# Patient Record
Sex: Female | Born: 1975 | Race: Black or African American | Hispanic: No | Marital: Married | Smoking: Never smoker
Health system: Southern US, Community
[De-identification: ages and names within clinical notes are randomized; demographics above are authoritative.]

## PROBLEM LIST (undated history)

## (undated) DIAGNOSIS — Z8619 Personal history of other infectious and parasitic diseases: Secondary | ICD-10-CM

## (undated) DIAGNOSIS — Z87898 Personal history of other specified conditions: Secondary | ICD-10-CM

## (undated) DIAGNOSIS — Z8709 Personal history of other diseases of the respiratory system: Secondary | ICD-10-CM

## (undated) DIAGNOSIS — K219 Gastro-esophageal reflux disease without esophagitis: Secondary | ICD-10-CM

## (undated) DIAGNOSIS — H409 Unspecified glaucoma: Secondary | ICD-10-CM

## (undated) DIAGNOSIS — H5010 Unspecified exotropia: Secondary | ICD-10-CM

## (undated) DIAGNOSIS — M1990 Unspecified osteoarthritis, unspecified site: Secondary | ICD-10-CM

## (undated) DIAGNOSIS — G43909 Migraine, unspecified, not intractable, without status migrainosus: Secondary | ICD-10-CM

## (undated) HISTORY — PX: DILATION AND CURETTAGE OF UTERUS: SHX78

## (undated) HISTORY — PX: REFRACTIVE SURGERY: SHX103

---

## 2000-11-17 HISTORY — PX: STRABISMUS SURGERY: SHX218

## 2004-10-30 ENCOUNTER — Encounter: Admission: RE | Admit: 2004-10-30 | Discharge: 2004-10-30 | Payer: Self-pay | Admitting: Occupational Medicine

## 2004-11-18 ENCOUNTER — Encounter: Admission: RE | Admit: 2004-11-18 | Discharge: 2004-12-12 | Payer: Self-pay | Admitting: Occupational Medicine

## 2005-02-01 ENCOUNTER — Emergency Department (HOSPITAL_COMMUNITY): Admission: EM | Admit: 2005-02-01 | Discharge: 2005-02-01 | Payer: Self-pay | Admitting: Family Medicine

## 2005-04-18 IMAGING — CR DG LUMBAR SPINE COMPLETE 4+V
5 series · 5 of 5 positions shown · non-contrast
Comparison: None.

CLINICAL DATA: Back pain. 
 LUMBAR SPINE ? FOUR VIEW:

[view not recorded (1 of 5)]
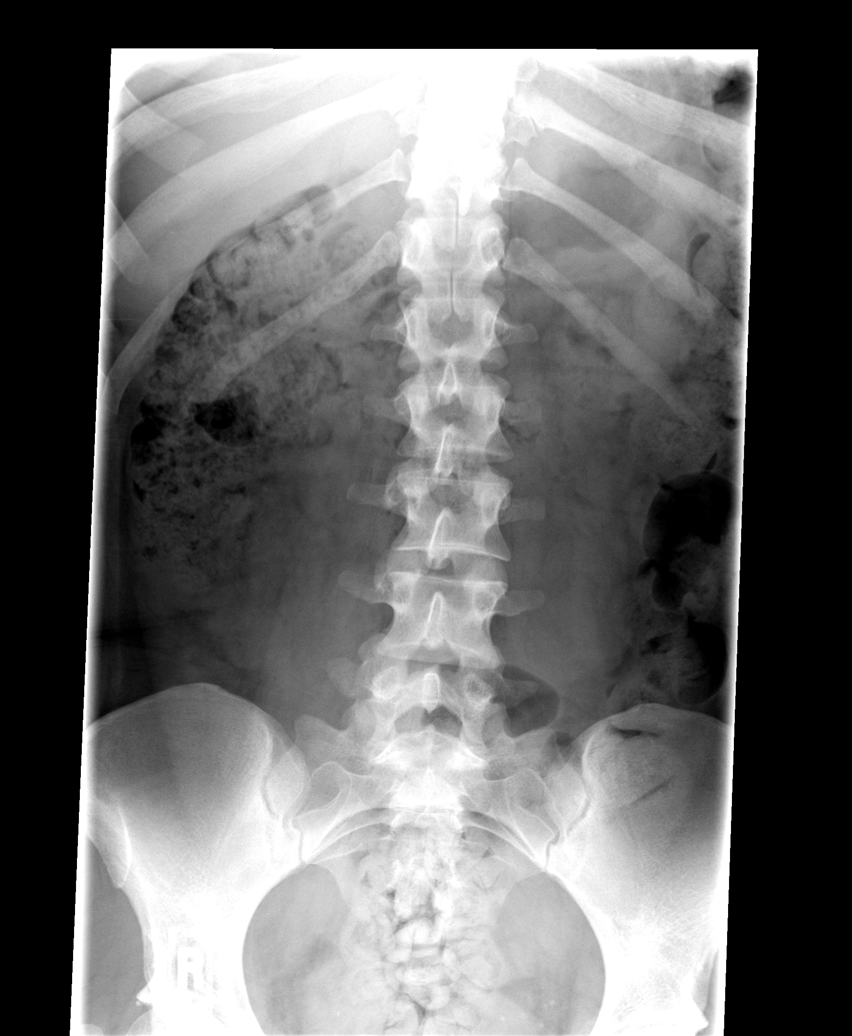

[view not recorded (2 of 5)]
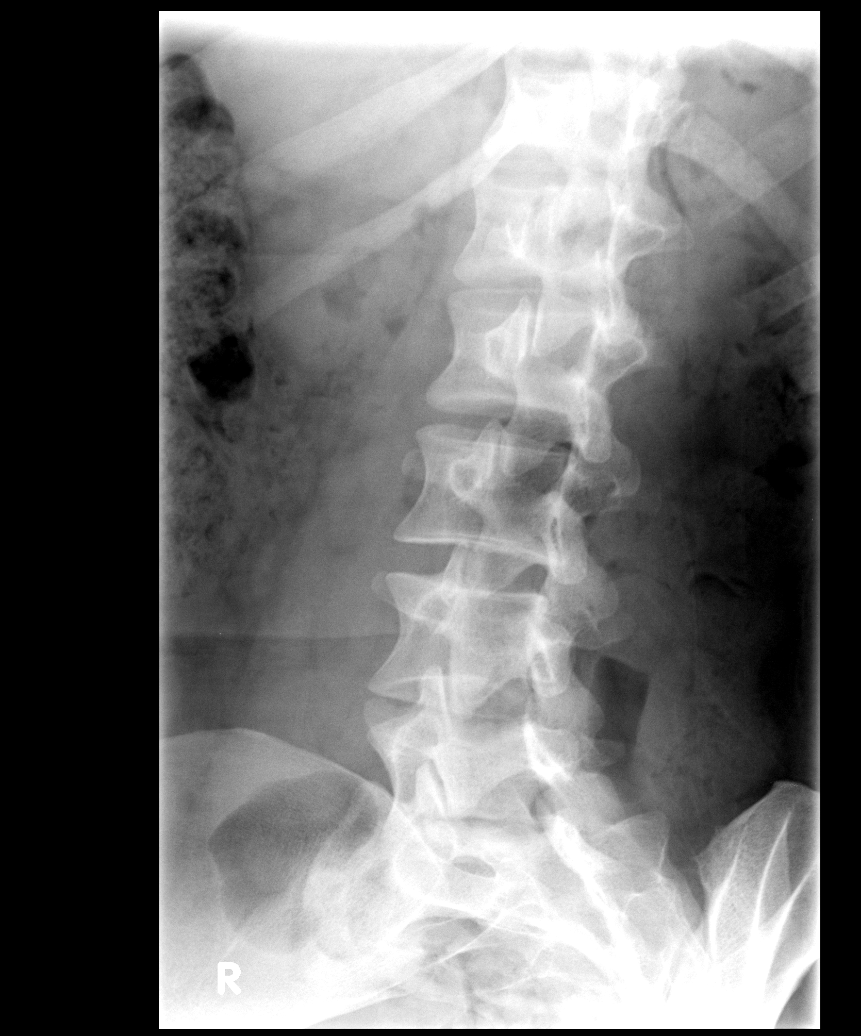

[view not recorded (3 of 5)]
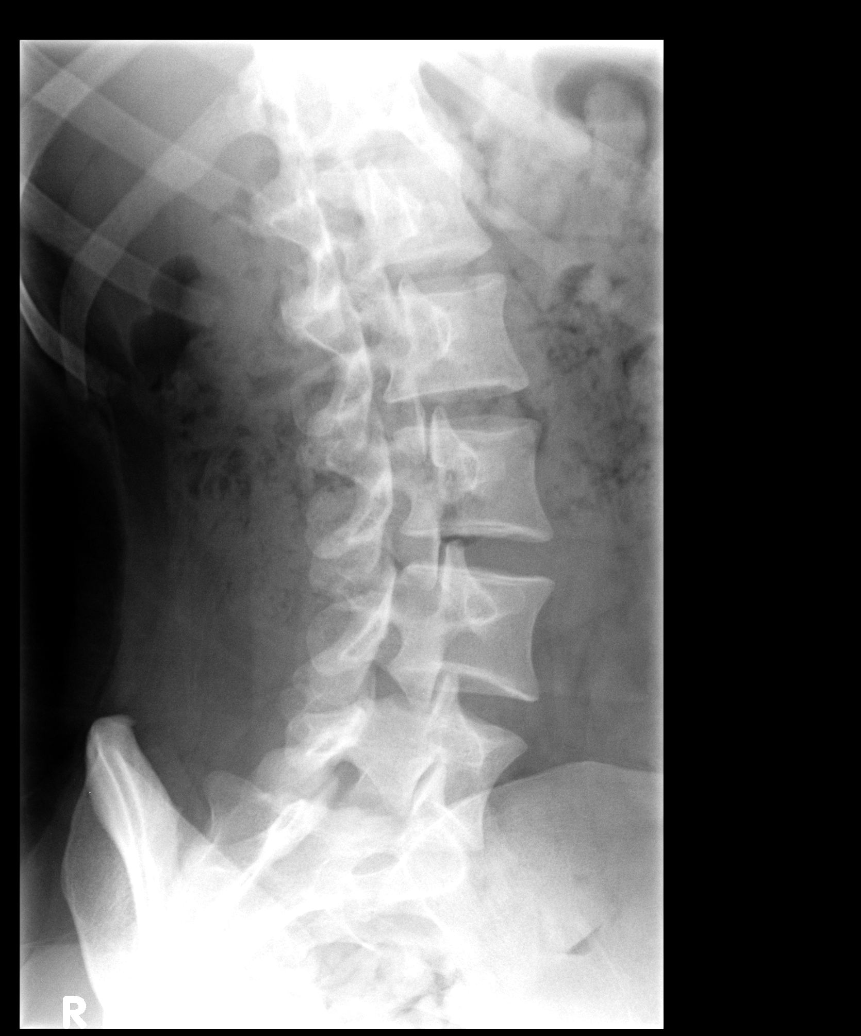

[view not recorded (4 of 5)]
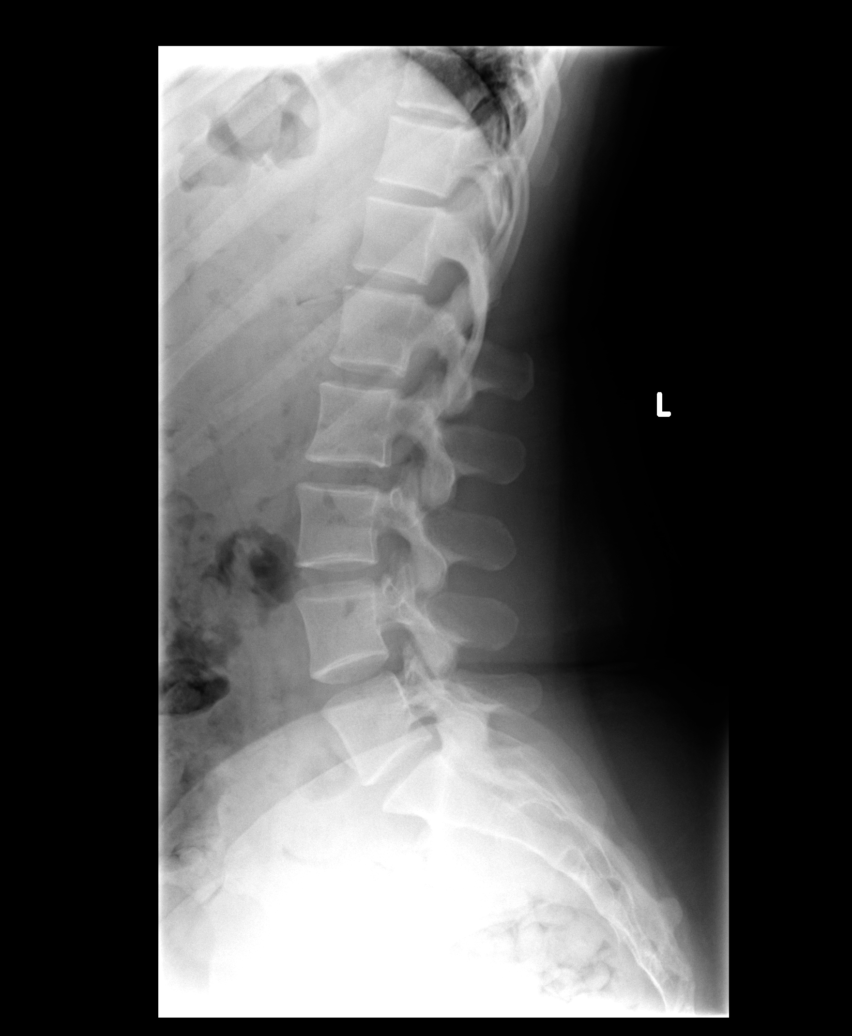

[view not recorded (5 of 5)]
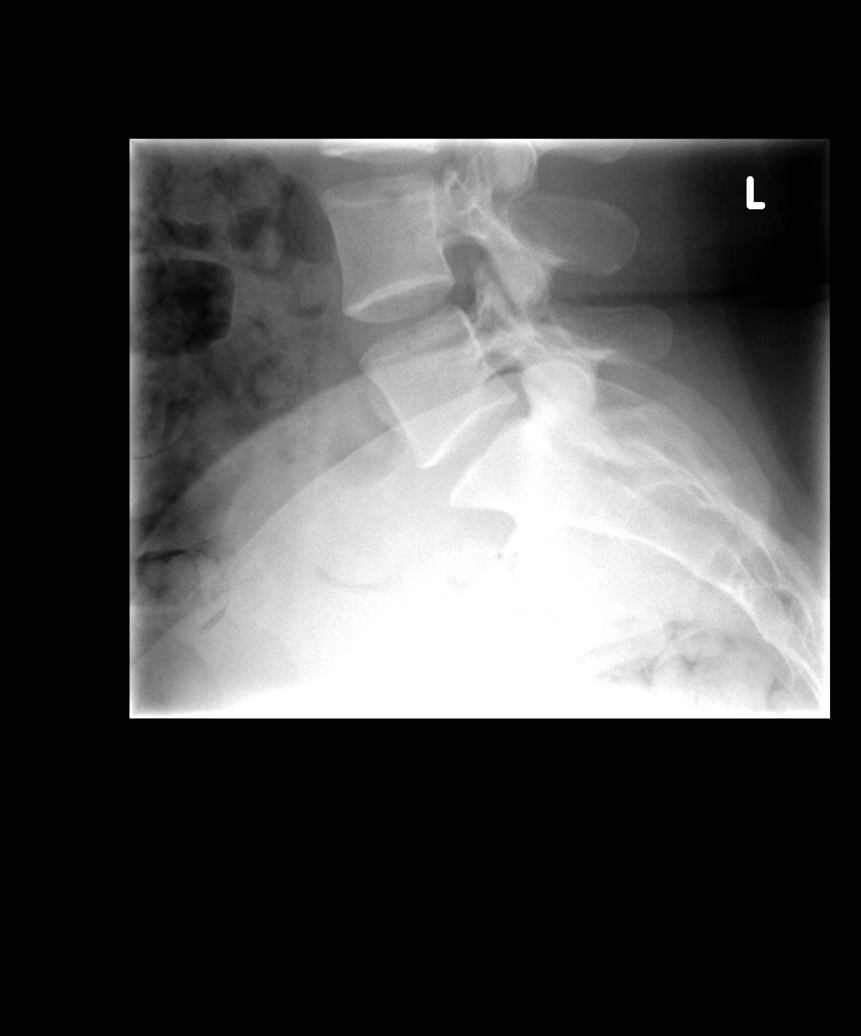

[5 of 5 positions shown; findings below may reference images not displayed]

FINDINGS: Four view lumbar spine series shows normal alignment in this patient with five non-rib bearing lumbar-type vertebral bodies.  The intervertebral disc spaces are preserved throughout.  Right pedicle at the level of L-3 appears enlarged on the frontal view but unremarkable on the oblique images and the frontal projection may be related to projection.  Frontal image demonstrates normal SI joints bilaterally.
IMPRESSION: No evidence for acute fracture or subluxation.

## 2005-09-11 ENCOUNTER — Ambulatory Visit: Payer: Self-pay | Admitting: Family Medicine

## 2005-10-31 ENCOUNTER — Ambulatory Visit: Payer: Self-pay | Admitting: Family Medicine

## 2005-11-25 ENCOUNTER — Ambulatory Visit: Payer: Self-pay | Admitting: Family Medicine

## 2005-12-25 ENCOUNTER — Ambulatory Visit: Payer: Self-pay | Admitting: Family Medicine

## 2006-01-08 ENCOUNTER — Ambulatory Visit: Payer: Self-pay | Admitting: Family Medicine

## 2006-01-13 ENCOUNTER — Ambulatory Visit: Payer: Self-pay | Admitting: Family Medicine

## 2006-01-28 ENCOUNTER — Ambulatory Visit: Payer: Self-pay | Admitting: Family Medicine

## 2006-03-05 ENCOUNTER — Other Ambulatory Visit: Admission: RE | Admit: 2006-03-05 | Discharge: 2006-03-05 | Payer: Self-pay | Admitting: Family Medicine

## 2006-03-05 ENCOUNTER — Ambulatory Visit: Payer: Self-pay | Admitting: Family Medicine

## 2006-03-05 ENCOUNTER — Encounter: Payer: Self-pay | Admitting: Family Medicine

## 2006-03-05 LAB — CONVERTED CEMR LAB: Pap Smear: NORMAL

## 2006-03-10 ENCOUNTER — Ambulatory Visit: Payer: Self-pay | Admitting: Family Medicine

## 2006-03-19 ENCOUNTER — Ambulatory Visit: Payer: Self-pay | Admitting: Family Medicine

## 2006-04-23 ENCOUNTER — Ambulatory Visit: Payer: Self-pay | Admitting: Family Medicine

## 2006-06-02 ENCOUNTER — Ambulatory Visit: Payer: Self-pay | Admitting: Family Medicine

## 2006-06-15 ENCOUNTER — Ambulatory Visit: Payer: Self-pay | Admitting: Family Medicine

## 2006-06-22 ENCOUNTER — Ambulatory Visit: Payer: Self-pay | Admitting: Family Medicine

## 2006-07-23 ENCOUNTER — Ambulatory Visit: Payer: Self-pay | Admitting: Family Medicine

## 2006-08-04 ENCOUNTER — Ambulatory Visit: Payer: Self-pay | Admitting: Family Medicine

## 2006-08-25 DIAGNOSIS — K589 Irritable bowel syndrome without diarrhea: Secondary | ICD-10-CM

## 2006-08-25 DIAGNOSIS — G43909 Migraine, unspecified, not intractable, without status migrainosus: Secondary | ICD-10-CM | POA: Insufficient documentation

## 2006-08-25 DIAGNOSIS — H409 Unspecified glaucoma: Secondary | ICD-10-CM | POA: Insufficient documentation

## 2006-08-25 DIAGNOSIS — K5900 Constipation, unspecified: Secondary | ICD-10-CM | POA: Insufficient documentation

## 2006-08-25 DIAGNOSIS — E039 Hypothyroidism, unspecified: Secondary | ICD-10-CM | POA: Insufficient documentation

## 2006-08-28 ENCOUNTER — Ambulatory Visit: Payer: Self-pay | Admitting: Family Medicine

## 2006-08-28 LAB — CONVERTED CEMR LAB
Basophils Absolute: 0 10*3/uL (ref 0.0–0.1)
Basophils percent auto: 0 % (ref 0–1)
Eosinophils Absolute: 0 cells/mcL (ref 0.0–0.7)
Eosinophils Relative: 0 % (ref 0–5)
HCT: 33.1 % — ABNORMAL LOW (ref 36.0–46.0)
Hemoglobin: 10.2 g/dL — ABNORMAL LOW (ref 12.0–15.0)
Leukocyte count, blood: 4.1 10*9/L (ref 4.0–10.5)
Lymphocytes Relative: 28 % (ref 12–46)
Lymphs Abs: 1.2 10*3/uL (ref 0.7–3.3)
MCHC: 30.8 g/dL (ref 30.0–36.0)
MCV: 87.8 fL (ref 78.0–100.0)
Monocytes Absolute: 0.5 10*3/uL (ref 0.2–0.7)
Monocytes Relative: 12 % — ABNORMAL HIGH (ref 3–11)
Neutro Abs: 2.4 10*3/uL (ref 1.7–7.7)
Neutrophils Relative %: 59 % (ref 43–77)
Platelets: 276 10*3/uL (ref 150–400)
RBC: 3.77 M/uL — ABNORMAL LOW (ref 3.87–5.11)
RDW: 15.4 % — ABNORMAL HIGH (ref 11.5–14.0)

## 2006-09-01 ENCOUNTER — Ambulatory Visit: Payer: Self-pay | Admitting: Family Medicine

## 2006-10-01 ENCOUNTER — Encounter: Payer: Self-pay | Admitting: Family Medicine

## 2006-10-01 DIAGNOSIS — D649 Anemia, unspecified: Secondary | ICD-10-CM

## 2006-10-07 ENCOUNTER — Ambulatory Visit: Payer: Self-pay | Admitting: Family Medicine

## 2006-10-09 DIAGNOSIS — N760 Acute vaginitis: Secondary | ICD-10-CM | POA: Insufficient documentation

## 2006-10-26 ENCOUNTER — Ambulatory Visit: Payer: Self-pay | Admitting: Family Medicine

## 2006-11-12 ENCOUNTER — Ambulatory Visit: Payer: Self-pay | Admitting: Family Medicine

## 2006-11-12 DIAGNOSIS — M545 Low back pain: Secondary | ICD-10-CM

## 2006-11-12 LAB — CONVERTED CEMR LAB
Bilirubin Urine: NEGATIVE
Blood in Urine, dipstick: NEGATIVE
Glucose, Urine, Semiquant: NEGATIVE
Ketones, urine, test strip: NEGATIVE
Nitrite: NEGATIVE
Protein, U semiquant: NEGATIVE
Specific Gravity, Urine: >=1.03
Urobilinogen, UA: 0.2
WBC Urine, dipstick: NEGATIVE
pH: 6

## 2006-12-02 ENCOUNTER — Ambulatory Visit: Payer: Self-pay | Admitting: Family Medicine

## 2006-12-02 DIAGNOSIS — K5909 Other constipation: Secondary | ICD-10-CM | POA: Insufficient documentation

## 2006-12-03 ENCOUNTER — Telehealth: Payer: Self-pay | Admitting: Family Medicine

## 2006-12-22 ENCOUNTER — Telehealth: Payer: Self-pay | Admitting: Family Medicine

## 2007-01-11 ENCOUNTER — Telehealth (INDEPENDENT_AMBULATORY_CARE_PROVIDER_SITE_OTHER): Payer: Self-pay | Admitting: *Deleted

## 2007-01-25 ENCOUNTER — Encounter: Payer: Self-pay | Admitting: Family Medicine

## 2007-01-25 LAB — CONVERTED CEMR LAB
ALT: 11 units/L
AST: 16 units/L
Albumin: 3.9 g/dL
Alkaline Phosphatase: 78 units/L
BUN: 120 mg/dL
CO2: 19 meq/L
Calcium: 8.7 mg/dL
Chloride: 112 meq/L
Creatinine, Ser: 0.83 mg/dL
Glucose, Bld: 89 mg/dL
Microalb, Ur: 7.39 mg/dL
Potassium: 3.8 meq/L
Sodium: 141 meq/L
Total Bilirubin: 0.2 mg/dL
Total Protein: 7.1 g/dL

## 2007-01-26 ENCOUNTER — Ambulatory Visit: Payer: Self-pay | Admitting: Family Medicine

## 2007-01-29 ENCOUNTER — Encounter: Payer: Self-pay | Admitting: Family Medicine

## 2007-02-23 ENCOUNTER — Telehealth: Payer: Self-pay | Admitting: Family Medicine

## 2007-02-24 ENCOUNTER — Encounter: Payer: Self-pay | Admitting: Family Medicine

## 2007-02-24 ENCOUNTER — Telehealth: Payer: Self-pay | Admitting: Family Medicine

## 2007-03-10 ENCOUNTER — Encounter: Payer: Self-pay | Admitting: Family Medicine

## 2007-03-11 ENCOUNTER — Ambulatory Visit: Payer: Self-pay | Admitting: Family Medicine

## 2007-03-12 ENCOUNTER — Telehealth: Payer: Self-pay | Admitting: Family Medicine

## 2007-03-19 ENCOUNTER — Telehealth: Payer: Self-pay | Admitting: Family Medicine

## 2007-03-30 ENCOUNTER — Telehealth: Payer: Self-pay | Admitting: Family Medicine

## 2007-04-01 ENCOUNTER — Telehealth: Payer: Self-pay | Admitting: Family Medicine

## 2007-04-05 ENCOUNTER — Encounter: Payer: Self-pay | Admitting: Family Medicine

## 2007-04-09 ENCOUNTER — Telehealth: Payer: Self-pay | Admitting: Family Medicine

## 2007-04-15 ENCOUNTER — Ambulatory Visit: Payer: Self-pay | Admitting: Family Medicine

## 2007-04-15 DIAGNOSIS — N644 Mastodynia: Secondary | ICD-10-CM

## 2007-04-15 DIAGNOSIS — R809 Proteinuria, unspecified: Secondary | ICD-10-CM | POA: Insufficient documentation

## 2007-04-15 LAB — CONVERTED CEMR LAB
Creatinine, urine POC: 100 mg/dL
Microalbumin U total vol: 10 mg/L

## 2007-04-20 ENCOUNTER — Encounter: Payer: Self-pay | Admitting: Family Medicine

## 2007-04-26 ENCOUNTER — Encounter: Payer: Self-pay | Admitting: Family Medicine

## 2007-04-29 ENCOUNTER — Encounter: Payer: Self-pay | Admitting: Family Medicine

## 2007-05-03 ENCOUNTER — Telehealth (INDEPENDENT_AMBULATORY_CARE_PROVIDER_SITE_OTHER): Payer: Self-pay | Admitting: *Deleted

## 2007-05-12 ENCOUNTER — Encounter: Payer: Self-pay | Admitting: Family Medicine

## 2007-05-27 ENCOUNTER — Other Ambulatory Visit: Admission: RE | Admit: 2007-05-27 | Discharge: 2007-05-27 | Payer: Self-pay | Admitting: Family Medicine

## 2007-05-27 ENCOUNTER — Ambulatory Visit: Payer: Self-pay | Admitting: Family Medicine

## 2007-05-27 ENCOUNTER — Encounter: Payer: Self-pay | Admitting: Family Medicine

## 2007-05-27 DIAGNOSIS — E785 Hyperlipidemia, unspecified: Secondary | ICD-10-CM | POA: Insufficient documentation

## 2007-05-29 ENCOUNTER — Encounter: Payer: Self-pay | Admitting: Family Medicine

## 2007-05-29 LAB — CONVERTED CEMR LAB
Clue Cells Wet Prep HPF POC: NONE SEEN
Trich, Wet Prep: NONE SEEN
Yeast Wet Prep HPF POC: NONE SEEN

## 2007-06-01 ENCOUNTER — Telehealth (INDEPENDENT_AMBULATORY_CARE_PROVIDER_SITE_OTHER): Payer: Self-pay | Admitting: *Deleted

## 2007-06-09 ENCOUNTER — Encounter: Payer: Self-pay | Admitting: Family Medicine

## 2007-06-09 LAB — CONVERTED CEMR LAB
Cholesterol: 129 mg/dL (ref 0–200)
HDL: 28 mg/dL — ABNORMAL LOW (ref >39)
LDL Cholesterol: 73 mg/dL (ref 0–99)
TSH: 0.448 microintl units/mL (ref 0.350–5.50)
Total CHOL/HDL Ratio: 4.6
Triglycerides: 141 mg/dL (ref <150)
VLDL: 28 mg/dL (ref 0–40)

## 2007-06-10 ENCOUNTER — Encounter: Payer: Self-pay | Admitting: Family Medicine

## 2007-06-24 ENCOUNTER — Ambulatory Visit: Payer: Self-pay | Admitting: Family Medicine

## 2007-09-14 ENCOUNTER — Encounter: Payer: Self-pay | Admitting: Family Medicine

## 2007-09-24 ENCOUNTER — Encounter: Payer: Self-pay | Admitting: Family Medicine

## 2007-10-04 ENCOUNTER — Telehealth: Payer: Self-pay | Admitting: Family Medicine

## 2007-10-06 ENCOUNTER — Encounter: Payer: Self-pay | Admitting: Family Medicine

## 2007-10-06 ENCOUNTER — Telehealth (INDEPENDENT_AMBULATORY_CARE_PROVIDER_SITE_OTHER): Payer: Self-pay | Admitting: *Deleted

## 2007-10-18 ENCOUNTER — Ambulatory Visit: Payer: Self-pay | Admitting: Family Medicine

## 2007-10-21 ENCOUNTER — Encounter: Payer: Self-pay | Admitting: Family Medicine

## 2007-11-01 ENCOUNTER — Telehealth: Payer: Self-pay | Admitting: Family Medicine

## 2007-11-03 ENCOUNTER — Encounter: Payer: Self-pay | Admitting: Family Medicine

## 2007-11-03 LAB — CONVERTED CEMR LAB
Hemoglobin: 9.9 g/dL — ABNORMAL LOW (ref 12.0–15.0)
TSH: 1.275 microintl units/mL (ref 0.350–5.50)

## 2007-11-25 ENCOUNTER — Encounter: Payer: Self-pay | Admitting: Family Medicine

## 2008-01-24 ENCOUNTER — Encounter: Payer: Self-pay | Admitting: Family Medicine

## 2008-01-28 ENCOUNTER — Encounter: Payer: Self-pay | Admitting: Family Medicine

## 2008-03-23 ENCOUNTER — Ambulatory Visit: Payer: Self-pay | Admitting: Family Medicine

## 2008-03-23 LAB — CONVERTED CEMR LAB
Bilirubin Urine: NEGATIVE
Blood in Urine, dipstick: NEGATIVE
Glucose, Urine, Semiquant: NEGATIVE
Ketones, urine, test strip: NEGATIVE
Nitrite: NEGATIVE
Protein, U semiquant: NEGATIVE
Specific Gravity, Urine: 1.015
Urobilinogen, UA: 0.2
WBC Urine, dipstick: NEGATIVE
pH: 7.5

## 2008-03-24 ENCOUNTER — Encounter: Payer: Self-pay | Admitting: Family Medicine

## 2008-04-13 ENCOUNTER — Telehealth: Payer: Self-pay | Admitting: Family Medicine

## 2008-05-02 ENCOUNTER — Ambulatory Visit: Payer: Self-pay | Admitting: Physician Assistant

## 2008-05-02 ENCOUNTER — Encounter: Payer: Self-pay | Admitting: Physician Assistant

## 2008-05-02 ENCOUNTER — Encounter: Payer: Self-pay | Admitting: Family Medicine

## 2008-05-09 ENCOUNTER — Ambulatory Visit: Payer: Self-pay | Admitting: Physician Assistant

## 2008-06-06 ENCOUNTER — Telehealth: Payer: Self-pay | Admitting: Family Medicine

## 2008-06-07 ENCOUNTER — Telehealth: Payer: Self-pay | Admitting: Family Medicine

## 2008-06-29 ENCOUNTER — Encounter: Payer: Self-pay | Admitting: Family Medicine

## 2008-07-12 ENCOUNTER — Encounter: Payer: Self-pay | Admitting: Family Medicine

## 2008-08-09 ENCOUNTER — Encounter: Payer: Self-pay | Admitting: Family Medicine

## 2008-08-10 ENCOUNTER — Telehealth: Payer: Self-pay | Admitting: Family Medicine

## 2008-08-11 ENCOUNTER — Telehealth: Payer: Self-pay | Admitting: Family Medicine

## 2008-10-11 ENCOUNTER — Telehealth: Payer: Self-pay | Admitting: Family Medicine

## 2008-11-08 ENCOUNTER — Ambulatory Visit: Payer: Self-pay | Admitting: Family Medicine

## 2008-11-08 DIAGNOSIS — D259 Leiomyoma of uterus, unspecified: Secondary | ICD-10-CM

## 2009-01-03 ENCOUNTER — Encounter: Payer: Self-pay | Admitting: Family Medicine

## 2009-01-09 ENCOUNTER — Encounter: Payer: Self-pay | Admitting: Family Medicine

## 2009-01-12 ENCOUNTER — Encounter: Payer: Self-pay | Admitting: Family Medicine

## 2009-02-09 ENCOUNTER — Encounter: Payer: Self-pay | Admitting: Family Medicine

## 2009-02-09 ENCOUNTER — Other Ambulatory Visit: Admission: RE | Admit: 2009-02-09 | Discharge: 2009-02-09 | Payer: Self-pay | Admitting: Family Medicine

## 2009-02-09 ENCOUNTER — Ambulatory Visit: Payer: Self-pay | Admitting: Family Medicine

## 2009-02-12 LAB — CONVERTED CEMR LAB
ALT: 8 units/L (ref 0–35)
AST: 14 units/L (ref 0–37)
Albumin: 4.2 g/dL (ref 3.5–5.2)
Alkaline Phosphatase: 73 units/L (ref 39–117)
BUN: 8 mg/dL (ref 6–23)
CO2: 24 meq/L (ref 19–32)
Calcium: 9 mg/dL (ref 8.4–10.5)
Chloride: 107 meq/L (ref 96–112)
Cholesterol: 140 mg/dL (ref 0–200)
Creatinine, Ser: 0.76 mg/dL (ref 0.40–1.20)
Glucose, Bld: 85 mg/dL (ref 70–99)
HDL: 37 mg/dL — ABNORMAL LOW (ref >39)
LDL Cholesterol: 85 mg/dL (ref 0–99)
Potassium: 3.8 meq/L (ref 3.5–5.3)
Sodium: 142 meq/L (ref 135–145)
TSH: 0.884 microintl units/mL (ref 0.350–4.500)
Total Bilirubin: 0.3 mg/dL (ref 0.3–1.2)
Total CHOL/HDL Ratio: 3.8
Total Protein: 7.5 g/dL (ref 6.0–8.3)
Triglycerides: 90 mg/dL (ref <150)
VLDL: 18 mg/dL (ref 0–40)

## 2009-02-22 ENCOUNTER — Encounter: Payer: Self-pay | Admitting: Family Medicine

## 2009-04-17 ENCOUNTER — Telehealth (INDEPENDENT_AMBULATORY_CARE_PROVIDER_SITE_OTHER): Payer: Self-pay | Admitting: *Deleted

## 2009-05-10 ENCOUNTER — Ambulatory Visit: Payer: Self-pay | Admitting: Family Medicine

## 2009-05-10 DIAGNOSIS — J019 Acute sinusitis, unspecified: Secondary | ICD-10-CM

## 2009-05-29 ENCOUNTER — Telehealth (INDEPENDENT_AMBULATORY_CARE_PROVIDER_SITE_OTHER): Payer: Self-pay | Admitting: *Deleted

## 2009-06-04 ENCOUNTER — Ambulatory Visit: Payer: Self-pay | Admitting: Family Medicine

## 2009-06-04 DIAGNOSIS — R5383 Other fatigue: Secondary | ICD-10-CM

## 2009-06-04 DIAGNOSIS — R5381 Other malaise: Secondary | ICD-10-CM

## 2009-06-13 ENCOUNTER — Telehealth: Payer: Self-pay | Admitting: Family Medicine

## 2009-06-13 DIAGNOSIS — N949 Unspecified condition associated with female genital organs and menstrual cycle: Secondary | ICD-10-CM

## 2009-06-14 ENCOUNTER — Encounter: Payer: Self-pay | Admitting: Family Medicine

## 2009-06-19 LAB — CONVERTED CEMR LAB
Preg, Serum: NEGATIVE
TSH: 0.671 microintl units/mL (ref 0.350–4.500)

## 2009-07-16 ENCOUNTER — Encounter: Payer: Self-pay | Admitting: Family Medicine

## 2009-07-16 ENCOUNTER — Telehealth: Payer: Self-pay | Admitting: Family Medicine

## 2009-07-18 ENCOUNTER — Telehealth: Payer: Self-pay | Admitting: Family Medicine

## 2009-10-01 ENCOUNTER — Encounter: Payer: Self-pay | Admitting: Family Medicine

## 2009-10-24 ENCOUNTER — Telehealth: Payer: Self-pay | Admitting: Family Medicine

## 2009-11-02 ENCOUNTER — Encounter: Payer: Self-pay | Admitting: Family Medicine

## 2009-11-17 DIAGNOSIS — Z8619 Personal history of other infectious and parasitic diseases: Secondary | ICD-10-CM

## 2009-11-17 DIAGNOSIS — Z8709 Personal history of other diseases of the respiratory system: Secondary | ICD-10-CM

## 2009-11-17 HISTORY — DX: Personal history of other infectious and parasitic diseases: Z86.19

## 2009-11-17 HISTORY — DX: Personal history of other diseases of the respiratory system: Z87.09

## 2009-12-12 ENCOUNTER — Telehealth: Payer: Self-pay | Admitting: Family Medicine

## 2010-01-01 ENCOUNTER — Telehealth: Payer: Self-pay | Admitting: Family Medicine

## 2010-01-11 ENCOUNTER — Telehealth (INDEPENDENT_AMBULATORY_CARE_PROVIDER_SITE_OTHER): Payer: Self-pay | Admitting: *Deleted

## 2010-06-13 ENCOUNTER — Ambulatory Visit: Payer: Self-pay | Admitting: Family Medicine

## 2010-06-13 DIAGNOSIS — L909 Atrophic disorder of skin, unspecified: Secondary | ICD-10-CM | POA: Insufficient documentation

## 2010-06-13 DIAGNOSIS — L919 Hypertrophic disorder of the skin, unspecified: Secondary | ICD-10-CM

## 2010-06-26 ENCOUNTER — Encounter: Payer: Self-pay | Admitting: Family Medicine

## 2010-07-24 ENCOUNTER — Encounter: Payer: Self-pay | Admitting: Family Medicine

## 2010-07-31 ENCOUNTER — Encounter: Payer: Self-pay | Admitting: Family Medicine

## 2010-08-01 ENCOUNTER — Encounter: Payer: Self-pay | Admitting: Family Medicine

## 2010-08-02 ENCOUNTER — Ambulatory Visit: Payer: Self-pay | Admitting: Family Medicine

## 2010-08-02 DIAGNOSIS — R6521 Severe sepsis with septic shock: Secondary | ICD-10-CM

## 2010-08-02 DIAGNOSIS — A419 Sepsis, unspecified organism: Secondary | ICD-10-CM | POA: Insufficient documentation

## 2010-08-05 ENCOUNTER — Encounter: Payer: Self-pay | Admitting: Family Medicine

## 2010-08-20 ENCOUNTER — Encounter: Payer: Self-pay | Admitting: Family Medicine

## 2010-08-23 ENCOUNTER — Encounter: Payer: Self-pay | Admitting: Family Medicine

## 2010-09-04 ENCOUNTER — Telehealth (INDEPENDENT_AMBULATORY_CARE_PROVIDER_SITE_OTHER): Payer: Self-pay | Admitting: *Deleted

## 2010-09-06 ENCOUNTER — Ambulatory Visit: Payer: Self-pay | Admitting: Family Medicine

## 2010-09-06 DIAGNOSIS — M6281 Muscle weakness (generalized): Secondary | ICD-10-CM | POA: Insufficient documentation

## 2010-09-06 DIAGNOSIS — I10 Essential (primary) hypertension: Secondary | ICD-10-CM

## 2010-09-16 ENCOUNTER — Encounter: Payer: Self-pay | Admitting: Family Medicine

## 2010-09-19 ENCOUNTER — Telehealth: Payer: Self-pay | Admitting: Family Medicine

## 2010-09-30 ENCOUNTER — Encounter: Payer: Self-pay | Admitting: Family Medicine

## 2010-10-03 ENCOUNTER — Ambulatory Visit: Payer: Self-pay | Admitting: Family Medicine

## 2010-10-03 ENCOUNTER — Encounter: Payer: Self-pay | Admitting: Family Medicine

## 2010-10-04 ENCOUNTER — Telehealth (INDEPENDENT_AMBULATORY_CARE_PROVIDER_SITE_OTHER): Payer: Self-pay | Admitting: *Deleted

## 2010-10-04 LAB — CONVERTED CEMR LAB
ALT: 21 units/L (ref 0–35)
AST: 24 units/L (ref 0–37)
Albumin: 4.7 g/dL (ref 3.5–5.2)
Alkaline Phosphatase: 73 units/L (ref 39–117)
BUN: 10 mg/dL (ref 6–23)
CO2: 26 meq/L (ref 19–32)
Calcium: 9.9 mg/dL (ref 8.4–10.5)
Chloride: 102 meq/L (ref 96–112)
Creatinine, Ser: 0.86 mg/dL (ref 0.40–1.20)
Glucose, Bld: 88 mg/dL (ref 70–99)
Potassium: 3.7 meq/L (ref 3.5–5.3)
Sodium: 139 meq/L (ref 135–145)
TSH: 0.618 microintl units/mL (ref 0.350–4.500)
Total Bilirubin: 0.7 mg/dL (ref 0.3–1.2)
Total Protein: 8.4 g/dL — ABNORMAL HIGH (ref 6.0–8.3)

## 2010-10-29 ENCOUNTER — Ambulatory Visit: Payer: Self-pay | Admitting: Family Medicine

## 2010-10-29 DIAGNOSIS — R350 Frequency of micturition: Secondary | ICD-10-CM

## 2010-10-29 LAB — CONVERTED CEMR LAB
Bilirubin Urine: NEGATIVE
Blood in Urine, dipstick: NEGATIVE
Glucose, Urine, Semiquant: NEGATIVE
Nitrite: NEGATIVE
Protein, U semiquant: NEGATIVE
Specific Gravity, Urine: >=1.03
Urobilinogen, UA: 0.2
WBC Urine, dipstick: NEGATIVE
pH: 6.5

## 2010-10-30 ENCOUNTER — Encounter: Payer: Self-pay | Admitting: Family Medicine

## 2010-10-31 ENCOUNTER — Encounter: Payer: Self-pay | Admitting: Family Medicine

## 2010-10-31 LAB — CONVERTED CEMR LAB
Clue Cells Wet Prep HPF POC: NONE SEEN
Trich, Wet Prep: NONE SEEN
Yeast Wet Prep HPF POC: NONE SEEN

## 2010-11-15 ENCOUNTER — Encounter: Payer: Self-pay | Admitting: Family Medicine

## 2010-11-21 ENCOUNTER — Telehealth (INDEPENDENT_AMBULATORY_CARE_PROVIDER_SITE_OTHER): Payer: Self-pay | Admitting: *Deleted

## 2010-11-28 ENCOUNTER — Encounter: Payer: Self-pay | Admitting: Family Medicine

## 2010-11-28 ENCOUNTER — Telehealth (INDEPENDENT_AMBULATORY_CARE_PROVIDER_SITE_OTHER): Payer: Self-pay | Admitting: *Deleted

## 2010-11-29 ENCOUNTER — Encounter: Payer: Self-pay | Admitting: Family Medicine

## 2010-11-29 ENCOUNTER — Ambulatory Visit
Admission: RE | Admit: 2010-11-29 | Discharge: 2010-11-29 | Payer: Self-pay | Source: Home / Self Care | Attending: Family Medicine | Admitting: Family Medicine

## 2010-11-29 DIAGNOSIS — M216X9 Other acquired deformities of unspecified foot: Secondary | ICD-10-CM | POA: Insufficient documentation

## 2010-12-17 NOTE — Assessment & Plan Note (Signed)
Summary: HFU septic shock   Vital Signs:  Patient profile:   35 year old female Menstrual status:  regular Height:      62 inches Weight:      153 pounds BMI:     28.09 O2 Sat:      99 % on Room air Pulse rate:   98 / minute BP sitting:   152 / 95  (left arm) Cuff size:   regular  Vitals Entered By: Payton Spark CMA (August 02, 2010 2:07 PM)  O2 Flow:  Room air CC: Hosp f/u.   Primary Care Provider:  Seymour Bars D.O.  CC:  Hosp f/u.Marland Kitchen  History of Present Illness: 35 yo WF presents for HFU visit.    Theresa Vaughn has been through a lot since our last OV.  She was hospitalized in August with a fetal demise from chorioamnionitis with E. Coli, suffering a profound septic shock with multi - organ failure requiring mechanical ventilation, weeks of IV antibiotics and emergent hemodialysis.  She had runs of PAT and VT in the setting of low magnesium and low potassium.  She was discharged from [facility] and transfered to [facility] where she started PT.  She was discharged from there on Sept 9th.    She was put on Toprol LX  which was decreased from two times a day to once a day and she reports that she has even been cutting the 50s in half due to leg swelling.    She reports that she is doing well today!  Her strength and energy are coming back.  She is no longer needing dialysis.  Denies any breathing problems or heart palpitations.    She has just f/u'd with [facility] cardiology.  Her spirits are good and she has a supportive family.    Current Medications (verified): 1)  Travatan 0.004 %  Soln (Travoprost) .... Use As Directed 2)  Synthroid 25 Mcg  Tabs (Levothyroxine Sodium) .... 1/2  Tab By Mouth Daily 3)  Flonase 50 Mcg/act  Susp (Fluticasone Propionate) .... Two Sprays Per Nostril Daily 4)  Pantoprazole Sodium 40 Mg Tbec (Pantoprazole Sodium) .... Take 1 Tablet By Mouth Once A Day 5)  Combigan 0.2-0.5 % Soln (Brimonidine Tartrate-Timolol) .... Oen Drop Right Eye Twice A Day 6)   Singulair 10 Mg Tabs (Montelukast Sodium) .Marland Kitchen.. 1 Tab By Mouth Qpm 7)  Iron 325 (65 Fe) Mg Tabs (Ferrous Sulfate) .Marland Kitchen.. 1 Tab By Mouth Once Daily 8)  Magnesium 250 Mg Tabs (Magnesium) .... Take 1 Tab By Mouth Once Daily 9)  Metoprolol Succinate 50 Mg Xr24h-Tab (Metoprolol Succinate) .... Take 1 Tab By Mouth At Bedtime  Allergies (verified): 1)  ! Sulfa 2)  ! Bactrim 3)  ! Cefpodoxime Proxetil (Cefpodoxime Proxetil) 4)  ! Augmentin 5)  ! Levaquin 6)  ! Tandem 7)  ! * Lamictal 8)  ! Avelox (Moxifloxacin Hcl) 9)  ! * Mycins 10)  ! * Monistat 11)  ! * Imbral/propanolol 12)  ! 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 Hx of interstitial cystitis diagnosed by Urology years ago.   migraines anemia E. Coli sepsis/ shock  secondary to chorioamnionitis/ fetal demise 06-2010  Past Surgical History: Reviewed history from 12/02/2006 and no changes required. LTCS  MRI brain/cspine- normal  sleep study normal  Social History: Reviewed history from 05/27/2007 and  no changes required. 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  Married to [place] and has 4 children -12,8,7,4.  Nonsmoker.  Drinks caffeine.  Does not exercise.  Review of Systems      See HPI  Physical Exam  General:  alert, well-developed, well-nourished, and well-hydrated.   Head:  normocephalic and atraumatic.   Eyes:  pupils equal, pupils round, and pupils reactive to light.  wears glasses Mouth:  good dentition and pharynx pink and moist.   Neck:  PICC line scar, well healed on the L side of neck, supple and no masses.   Lungs:  Normal respiratory effort, chest expands symmetrically. Lungs are clear to auscultation, no crackles or wheezes. Heart:  Normal rate and regular rhythm. S1 and S2 normal without gallop, murmur, click, rub or other extra  sounds. Abdomen:  soft, non-tender, and no distention.   Pulses:  2+ radial pulses Extremities:  trace LE edema bilat Neurologic:  bilat foot drop with slow gait.  DTRs symmetrical and normal.   Skin:  color normal.   Cervical Nodes:  No lymphadenopathy noted Psych:  good eye contact, not anxious appearing, and not depressed appearing.     Impression & Recommendations:  Problem # 1:  SEPTIC SHOCK (ICD-785.52) Reviewed her lengthy hospital/ specialty notes.   She has done remarkably well, in recovery.   She is to complete PT -- she notes that her foot drop is from laying in the bed for so long. She has seen cards back for PAT and VT and her electrolytes seem to have normalized.  Will get a copy of this wk's labs that they drew. She is off IV abx and her PICC line site appears clean. Denies any dysphagia problems s/p intubation and denies any voiding dysfunction. She has elevated BP today but has cut her own Toprol dose in half due to leg swelling. Will send a note back to cards.  I did not add a diuretic w/o seeing what her BUN/CR were this wk.   Recommend getting flu vaccine in the next 4 wks.  Complete Medication List: 1)  Travatan 0.004 % Soln (Travoprost) .... Use as directed 2)  Synthroid 25 Mcg Tabs (Levothyroxine sodium) .... 1/2  tab by mouth daily 3)  Flonase 50 Mcg/act Susp (Fluticasone propionate) .... Two sprays per nostril daily 4)  Pantoprazole Sodium 40 Mg Tbec (Pantoprazole sodium) .... Take 1 tablet by mouth once a day 5)  Combigan 0.2-0.5 % Soln (Brimonidine tartrate-timolol) .... Oen drop right eye twice a day 6)  Singulair 10 Mg Tabs (Montelukast sodium) .Marland Kitchen.. 1 tab by mouth qpm 7)  Iron 325 (65 Fe) Mg Tabs (Ferrous sulfate) .Marland Kitchen.. 1 tab by mouth once daily 8)  Magnesium 250 Mg Tabs (Magnesium) .... Take 1 tab by mouth once daily 9)  Metoprolol Succinate 50 Mg Xr24h-tab (Metoprolol succinate) .... Take 1 tab by mouth at bedtime 10)  Prenatal Ad Tabs (Prenatal  vit-dss-fe cbn-fa) .... Take 1 tab by mouth once daily  Patient Instructions: 1)  Stay on current meds. 2)  I will get your labs from Dr Quita Skye office. 3)  Complete PT. 4)  Will complete any forms you need for work. 5)  Have neurologist fax me notes from upcoming visit. 6)  Repeat BP: 7)  Return for follow up in 2 mos.

## 2010-12-17 NOTE — Progress Notes (Signed)
Summary: TSH labs  Phone Note Call from Patient   Caller: [phone] Summary of Call: Pt states she feels like there has been some changes in her thyroid because she has felt very tired. Is it time to recheck TSH? Initial call taken by: Payton Spark CMA,  January 01, 2010 8:45 AM  Follow-up for Phone Call        due 3-1. Follow-up by: Seymour Bars DO,  January 01, 2010 8:50 AM     Appended Document: TSH labs [facility] informing Pt of the above

## 2010-12-17 NOTE — Miscellaneous (Signed)
Summary: PT/[facility]  PT/[facility]   Imported By: Lanelle Bal 09/04/2010 12:57:47  _____________________________________________________________________  External Attachment:    Type:   Image     Comment:   External Document

## 2010-12-17 NOTE — Letter (Signed)
Summary: [facility]  [facility]   Imported By: Lanelle Bal 10/22/2010 12:53:01  _____________________________________________________________________  External Attachment:    Type:   Image     Comment:   External Document

## 2010-12-17 NOTE — Progress Notes (Signed)
Summary: Valacyclovir Rx  Phone Note Call from Patient   Caller: Patient Summary of Call: After receiving refill request for valacyclovir, Dr. Cathey Endow asked that I call Pt to see if she was taking Rx for herpes prevention. I spoke w/ Pt about this, Pt was very vague but she said that she went to UC back in the summer for a bump on her vagina. UC did not culture or do any blood work bc she had an outbreak of HSV years ago. When I asked Pt if she had vaginal herpes she said she wasnt sure. Pt said, "I take valtrex when I get bumps".  Initial call taken by: Payton Spark CMA,  December 12, 2009 2:56 PM    New/Updated Medications: VALTREX 500 MG TABS (VALACYCLOVIR HCL) 1 tab by mouth two times a day x 3 days as needed herpes flare Prescriptions: VALTREX 500 MG TABS (VALACYCLOVIR HCL) 1 tab by mouth two times a day x 3 days as needed herpes flare  #18 x 1   Entered and Authorized by:   Seymour Bars DO   Signed by:   Seymour Bars DO on 12/12/2009   Method used:   Electronically to        [facility]* (retail)       [address]       [place], [place]  [address]       Ph: [phone]       Fax: [phone]   RxID:   [id number]   Appended Document: Valacyclovir Rx LMOM informing Pt

## 2010-12-17 NOTE — Assessment & Plan Note (Signed)
Summary: f/u leg swelling   Vital Signs:  Patient profile:   35 year old female Menstrual status:  regular Height:      62 inches Weight:      149 pounds BMI:     27.35 O2 Sat:      99 % on Room air Pulse rate:   100 / minute BP sitting:   124 / 85  (left arm) Cuff size:   large  Vitals Entered By: Payton Spark CMA (October 03, 2010 9:30 AM)  O2 Flow:  Room air CC: F/U swelling   Primary Care Provider:  Seymour Bars D.O.  CC:  F/U swelling.  History of Present Illness: Theresa Vaughn presents for f/u swelling.  She tried to go back to work 4 hrs/ day and her foot hurt at the end of the day.  The following  week , her L foot hurt -- this is the one with foot drop.  Dr Marisa Sprinkles (neuro) saw her back and she had an EMG done.  She is out of work again for 6 more wks and is doing PT 2 x a wk.  It is slowly improving.    She is on furosemide.  She has not been back to the cardiologist.    She had an EMG and has foot drop and partial sciatica.-- both on the L.   Current Medications (verified): 1)  Travatan 0.004 %  Soln (Travoprost) .... Use As Directed 2)  Synthroid 25 Mcg  Tabs (Levothyroxine Sodium) .... 1/2  Tab By Mouth Daily 3)  Flonase 50 Mcg/act  Susp (Fluticasone Propionate) .... Two Sprays Per Nostril Daily 4)  Pantoprazole Sodium 40 Mg Tbec (Pantoprazole Sodium) .... Take 1 Tablet By Mouth Once A Day 5)  Combigan 0.2-0.5 % Soln (Brimonidine Tartrate-Timolol) .... Oen Drop Right Eye Twice A Day 6)  Iron 325 (65 Fe) Mg Tabs (Ferrous Sulfate) .Marland Kitchen.. 1 Tab By Mouth Once Daily 7)  Magnesium 250 Mg Tabs (Magnesium) .... Take 1 Tab By Mouth Once Daily 8)  Metoprolol Succinate 50 Mg Xr24h-Tab (Metoprolol Succinate) .... Take 1 Tab By Mouth At Bedtime 9)  Prenatal Ad  Tabs (Prenatal Vit-Dss-Fe Cbn-Fa) .... Take 1 Tab By Mouth Once Daily 10)  Nortriptyline Hcl 10 Mg Caps (Nortriptyline Hcl) .... Take Three Capsules By Mouth At Bedtime 11)  Furosemide 20 Mg Tabs (Furosemide) .Marland Kitchen.. 1 Tab By  Mouth Once Daily As Needed For Swelling  Allergies (verified): 1)  ! Sulfa 2)  ! Bactrim 3)  ! Cefpodoxime Proxetil (Cefpodoxime Proxetil) 4)  ! Augmentin 5)  ! Levaquin 6)  ! Tandem 7)  ! * Lamictal 8)  ! Avelox (Moxifloxacin Hcl) 9)  ! * Mycins 10)  ! * Monistat 11)  ! * Imbral/propanolol 12)  ! 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 Hx of interstitial cystitis diagnosed by Urology years ago.   migraines anemia E. 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  Nonsmoker.  Drinks caffeine.  Does not exercise.  Review of Systems      See HPI  Physical Exam  General:  alert, well-developed, well-nourished, and well-hydrated.   Head:  normocephalic and atraumatic.   Mouth:  good dentition and pharynx pink and moist.   Neck:  no masses.   Lungs:  Normal respiratory effort, chest expands symmetrically. Lungs are clear to auscultation, no crackles or wheezes. Heart:  Normal rate and regular rhythm. S1 and S2 normal without gallop, murmur, click, rub or other extra sounds. Msk:  notable (mild) L foot drop with ambulation Pulses:  2+ pedal pulses Extremities:  no LE / pedal edema at present time Neurologic:  +4/5 L foot dorsiflexion strength   Impression & Recommendations:  Problem # 1:  ESSENTIAL HYPERTENSION, BENIGN (ICD-401.1) HTN/ palpitations have improved on 1/2 tab of Metoprolol so will continue along with daily fluid pill for edema which is likely to be secondary to her neuropathy.  She has f/u with cardiology in 2 wks.  Since no longer having  pAF, may be able to come off the BB. Her updated medication list for this problem includes:    Metoprolol Succinate 50 Mg Xr24h-tab (Metoprolol succinate) .Marland Kitchen... Take 1 tab by mouth at bedtime    Furosemide 40 Mg Tabs (Furosemide) .Marland Kitchen... 1 tab by mouth daily as needed for swelling  Orders: T-Comprehensive Metabolic Panel ([id number])  Problem # 2:  MUSCLE WEAKNESS (GENERALIZED) (ICD-728.87) She had an EMG done by Dr Marisa Sprinkles after she had completed PT and went back to work Part time.  She had L foot/ ankle pain from prolonged walking w/ her foot drop and had to stop working again.  She did indeed have foot drop and L sciatic neuropathy on nerve testing and is back in PT 2 x a wk.  I completed her disability form today to keep her OUT until Jan 1st and will have her go back part time for the first month.  Complete Medication List: 1)  Travatan 0.004 % Soln (Travoprost) .... Use as directed 2)  Synthroid 25 Mcg Tabs (Levothyroxine sodium) .... 1/2  tab by mouth daily 3)  Flonase 50 Mcg/act Susp (Fluticasone propionate) .... Two sprays per nostril daily 4)  Pantoprazole Sodium 40 Mg Tbec (Pantoprazole sodium) .... Take 1 tablet by mouth once a day 5)  Combigan 0.2-0.5 % Soln (Brimonidine tartrate-timolol) .... Oen drop right eye twice a day 6)  Iron 325 (65 Fe) Mg Tabs (Ferrous sulfate) .Marland Kitchen.. 1 tab by mouth once daily 7)  Magnesium 250 Mg Tabs (Magnesium) .... Take 1 tab by mouth once daily 8)  Metoprolol Succinate 50 Mg Xr24h-tab (Metoprolol succinate) .... Take 1 tab by mouth at bedtime 9)  Prenatal Ad Tabs (Prenatal vit-dss-fe cbn-fa) .... Take 1 tab by mouth once daily 10)  Nortriptyline Hcl 10 Mg Caps (Nortriptyline hcl) .... Take three capsules by mouth at bedtime 11)  Furosemide 40 Mg Tabs (Furosemide) .Marland Kitchen.. 1 tab by mouth daily as needed for swelling  Other Orders: T-TSH ([id number])  Patient Instructions: 1)  Stay on current meds. 2)  Use Furosemide as needed for swelling. 3)  Elevate  legs, stick to low sodium diet and wear compression stockings for swelling. 4)  Stay on 1/2 tab of Metoprolol daily until f/u with cardiology. 5)  I will work on your form. 6)  Labs today. 7)  Will call you w/ results tomorrow. Prescriptions: FUROSEMIDE 40 MG TABS (FUROSEMIDE) 1 tab by mouth daily as needed for swelling  #30 x 1   Entered and Authorized by:   Seymour Bars DO   Signed by:   Seymour Bars DO  on 10/03/2010   Method used:   Electronically to        [facility] (retail)       [address]       [place], [place]  [address]       Ph: [phone]       Fax: [phone]   RxID:   [id number]    Orders Added: 1)  T-Comprehensive Metabolic Panel [[id number]] 2)  T-TSH [[id number]] 3)  Est. Patient Level IV [44034]

## 2010-12-17 NOTE — Letter (Signed)
Summary: Discharge Instructions/[facility]  Discharge Instructions/[facility]   Imported By: Sherian Rein 08/03/2010 11:07:30  _____________________________________________________________________  External Attachment:    Type:   Image     Comment:   External Document

## 2010-12-17 NOTE — Letter (Signed)
Summary: Work Excuse  [facility]  [address] [place] [address], [address]   [place], [place] [address]   Phone: [phone]  Fax: [phone]    Today's Date: September 06, 2010  Name of Patient: Theresa Vaughn  The above named patient had a medical visit today at:  am / pm.  Please take this into consideration when reviewing the time away from work/school.    Special Instructions:  No standing > 30 min at a time; no lifting > 20 lbs; no walking > 1 hr at a time from 09-09-2010 through 10-06-2010  [  ] None  [  ] To be off the remainder of today, returning to the normal work / school schedule tomorrow.  [  ] To be off until the next scheduled appointment on ______________________.  [  ] Other ________________________________________________________________ ________________________________________________________________________   Sincerely yours,   Seymour Bars DO

## 2010-12-17 NOTE — Letter (Signed)
Summary: [facility]  [facility]   Imported By: Lanelle Bal 08/12/2010 11:50:19  _____________________________________________________________________  External Attachment:    Type:   Image     Comment:   External Document

## 2010-12-17 NOTE — Assessment & Plan Note (Signed)
Summary: Skin tag removal - Under R breast proc rm   Vital Signs:  Patient Profile:   35 Years Old Female CC:      Skin Tag - R breast  Height:     62 inches Weight:      174 pounds O2 Sat:      100 % O2 treatment:    Room Air Temp:     98.3 degrees F oral Pulse rate:   97 / minute Pulse rhythm:   regular Resp:     16 per minute BP sitting:   126 / 80  (right arm) Cuff size:   regular  Vitals Entered By: Areta Haber CMA (June 13, 2010 5:59 PM)                  Current Allergies: ! SULFA ! BACTRIM ! CEFPODOXIME PROXETIL (CEFPODOXIME PROXETIL) ! AUGMENTIN ! LEVAQUIN ! TANDEM ! * LAMICTAL ! AVELOX (MOXIFLOXACIN HCL) ! * MYCINS ! * MONISTAT ! * IMBRAL/PROPANOLOL ! CODEINE     History of Present Illness Chief Complaint: Skin Tag - R breast  History of Present Illness:  Subjective:  Patient complains of two small skin tags beneath her right breast that have become persistently irritated by her bra.  Current Problems: SKIN TAG (ICD-701.9) DELAYED MENSES (ICD-626.8) FATIGUE (ICD-780.79) ACUTE SINUSITIS, UNSPECIFIED (ICD-461.9) HEALTHY ADULT FEMALE (ICD-V70.0) ROUTINE GYNECOLOGICAL EXAMINATION (ICD-V72.31) FIBROIDS, UTERUS (ICD-218.9) FAMILY PLANNING (ICD-V25.09) DYSLIPIDEMIA (ICD-272.4) MICROALBUMINURIA (ICD-791.0) BREAST PAIN (ICD-611.71) CONSTIPATION, CHRONIC (ICD-564.09) LUMBAGO (ICD-724.2) VAGINITIS (ICD-616.10) ANEMIA NOS (ICD-285.9) MIGRAINE, UNSPEC., W/O INTRACTABLE MIGRAINE (ICD-346.90) IRRITABLE BOWEL SYNDROME (ICD-564.1) HYPOTHYROIDISM, UNSPECIFIED (ICD-244.9) GLAUCOMA (ICD-365.9) CONSTIPATION (ICD-564.0)   Current Meds GLYCOLAX  POWD (POLYETHYLENE GLYCOL 3350) 17 g by mouth daily as needed for constipation TRAVATAN 0.004 %  SOLN (TRAVOPROST) use as directed SYNTHROID 25 MCG  TABS (LEVOTHYROXINE SODIUM) 1 tab by mouth daily [BMN] FLONASE 50 MCG/ACT  SUSP (FLUTICASONE PROPIONATE) TWO SPRAYS PER NOSTRIL DAILY ZOFRAN 8 MG TABS  (ONDANSETRON HCL) 1 tab by mouth q 8 hrs as needed nausea PANTOPRAZOLE SODIUM 40 MG TBEC (PANTOPRAZOLE SODIUM) Take 1 tablet by mouth once a day * RESTASIS one drop each eye twice a day COMBIGAN 0.2-0.5 % SOLN (BRIMONIDINE TARTRATE-TIMOLOL) oen drop right eye twice a day SINGULAIR 10 MG TABS (MONTELUKAST SODIUM) 1 tab by mouth qPM VALTREX 500 MG TABS (VALACYCLOVIR HCL) 1 tab by mouth two times a day x 3 days as needed herpes flare IRON 325 (65 FE) MG TABS (FERROUS SULFATE) 1 tab by mouth once daily  REVIEW OF SYSTEMS Constitutional Symptoms      Denies fever, chills, night sweats, weight loss, weight gain, and fatigue.  Eyes       Denies change in vision, eye pain, eye discharge, glasses, contact lenses, and eye surgery. Ear/Nose/Throat/Mouth       Denies hearing loss/aids, change in hearing, ear pain, ear discharge, dizziness, frequent runny nose, frequent nose bleeds, sinus problems, sore throat, hoarseness, and tooth pain or bleeding.  Respiratory       Denies dry cough, productive cough, wheezing, shortness of breath, asthma, bronchitis, and emphysema/COPD.  Cardiovascular       Denies murmurs, chest pain, and tires easily with exhertion.    Gastrointestinal       Denies stomach pain, nausea/vomiting, diarrhea, constipation, blood in bowel movements, and indigestion. Genitourniary       Denies painful urination, kidney stones, and loss of urinary control. Neurological       Denies paralysis, seizures, and fainting/blackouts. Musculoskeletal  Denies muscle pain, joint pain, joint stiffness, decreased range of motion, redness, swelling, muscle weakness, and gout.  Skin       Complains of hair/skni or nail changes.      Denies bruising and unusual mles/lumps or sores.      Comments: Skin tag under R breast x mths Psych       Denies mood changes, temper/anger issues, anxiety/stress, speech problems, depression, and sleep problems. Other Comments: Pt states that she had skin tag  under L breast removed by PCP x 2 yrs ago. Pt states since being pregnant the skin tag under her R breast is now bothering her. Pt has not seen PCP for this.   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 Hx of interstitial cystitis diagnosed by Urology years ago.   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  Nonsmoker.  Drinks caffeine.  Does not exercise.  Risk Factors: Smoking Status: never (10/01/2006)   Objective:  No acute distress  Skin:  On the inferior-medial aspect of the right breast are two benign appearing pigmented skin tags about 3mm dia that appear irritated but not infected.  Lesions are about 2 cm apart. Assessment New Problems: SKIN TAG (ICD-701.9)   Plan New Orders: Removal of Skin Tags up to 15 Lesions [11200] New Patient Level III [99203] Planning Comments:   Wound precautions explained.  Apply Bacitracin and bandage daily until healed.  Return for any signs of infection.   The patient and/or caregiver has been counseled thoroughly with regard to medications prescribed including dosage, schedule, interactions, rationale for use, and possible side effects and they verbalize understanding.  Diagnoses and expected course of recovery discussed and will return if not improved as expected or if the condition worsens. Patient and/or caregiver verbalized understanding.   PROCEDURE:  Lesion Removal Site: Right medial breast Size: 3mm  each Number of Lesions: 2 Anesthesia: 1% Lidocaine w/epinephrine Procedure: Procedure:  Lesion Removal Risks and benefits of procedure explained to patient and verbal consent granted.  Using sterile technique and local anesthesia with 1% lidocaine with epinephrine, cleansed affected area with Betadine and saline. Removed each lesion at its base with a #11 blade.   Cauterized each excision site with silver nitrate stick.  Bacitracin and andage applied.  Patient tolerated well.   Orders Added: 1)  Removal of Skin Tags up to 15 Lesions [11200] 2)  New Patient Level III [[address]]

## 2010-12-17 NOTE — Progress Notes (Signed)
Summary: Swelling  Phone Note Call from Patient   Caller: Patient Summary of Call: Pt LMOM stating she is having swelling in her legs and feet and would like to know if you will send her a Rx to the pharm or if she needs OV first? FYI- She also stated she  is scheduled for nerve conduction study by the neurologist.  Initial call taken by: Payton Spark CMA,  September 19, 2010 10:07 AM  Follow-up for Phone Call        Sent RX for furosemide. Needs f/u in 1-2 wks - either here or with cards for swelling. Follow-up by: Seymour Bars DO,  September 19, 2010 10:25 AM    New/Updated Medications: FUROSEMIDE 20 MG TABS (FUROSEMIDE) 1 tab by mouth once daily as needed for swelling Prescriptions: FUROSEMIDE 20 MG TABS (FUROSEMIDE) 1 tab by mouth once daily as needed for swelling  #15 x 0   Entered and Authorized by:   Seymour Bars DO   Signed by:   Seymour Bars DO on 09/19/2010   Method used:   Electronically to        [facility]* (retail)       [address]       [place], [place]  [address]       Ph: [phone]       Fax: [phone]   RxID:   [id number]   Appended Document: Swelling Pt aware of the above

## 2010-12-17 NOTE — Progress Notes (Signed)
Summary: [place] form  Phone Note Call from Patient Call back at [employer] [phone]   Caller: Patient Call For: Seymour Bars DO Summary of Call: Pt calls and would like for you to let her know when you have faxed the form to [facility]. She called them and they said they had not received it yet Initial call taken by: Kathlene November LPN,  September 04, 2010 8:46 AM  Follow-up for Phone Call        Pt aware that we have not received forms yet Follow-up by: Payton Spark CMA,  September 04, 2010 8:52 AM

## 2010-12-17 NOTE — Letter (Signed)
Summary: Out of Work  [facility]  [address]   [place], [place] [address]   Phone: [phone]  Fax: [phone]    August 02, 2010   Employee:  Theresa Vaughn    To Whom It May Concern:   For Medical reasons, please excuse the above named employee from work for the following dates:  Start:   Sept 16th - open ended; see disability form  End:    If you need additional information, please feel free to contact our office.         Sincerely,    Seymour Bars DO

## 2010-12-17 NOTE — Miscellaneous (Signed)
Summary: PT Report/[facility]  PT Report/[facility]   Imported By: Lanelle Bal 10/22/2010 13:12:26  _____________________________________________________________________  External Attachment:    Type:   Image     Comment:   External Document

## 2010-12-17 NOTE — Progress Notes (Signed)
----   Converted from flag ---- ---- 10/04/2010 9:47 AM, Theresa Bars DO wrote: Pls let pt know that form will be faxed on Monday. ------------------------------  Pt notified of above instruction. KJ LPN [id number]

## 2010-12-17 NOTE — Assessment & Plan Note (Signed)
Summary: leg weakness/ form   Vital Signs:  Patient profile:   35 year old female Menstrual status:  regular Height:      62 inches Weight:      151 pounds Temp:     98.0 degrees F oral Pulse rate:   82 / minute BP sitting:   117 / 73  (left arm) Cuff size:   regular  Vitals Entered By: Kathlene November LPN (September 06, 2010 9:24 AM) CC: fill forms out for disability and ? return to work   Primary Care Provider:  Seymour Bars D.O.  CC:  fill forms out for disability and ? return to work.  History of Present Illness: 35 yo F presents for f/u visit.  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  She had some foot drop and bilat leg weakness that is improving with PT 2 x a wk.  Her energy level is improving.  It was recommended that due to continued leg/ foot weakness that she work 4 hrs/ day for the first 4 wks back at work to regain her strength.  her leg swelling has improved with use of compression hose and med changes.  her BP has also improved and she is no longer having heart palpitations.  Her neurologist has been managing her migraines.  She feels ready to go back to work at least part time on Monday.  Current Medications (verified): 1)  Travatan 0.004 %  Soln (Travoprost) .... Use As Directed 2)  Synthroid 25 Mcg  Tabs (Levothyroxine Sodium) .... 1/2  Tab By Mouth Daily 3)  Flonase 50 Mcg/act  Susp (Fluticasone Propionate) .... Two Sprays Per Nostril Daily 4)  Pantoprazole Sodium 40 Mg Tbec (Pantoprazole Sodium) .... Take 1 Tablet By Mouth Once A Day 5)  Combigan 0.2-0.5 % Soln (Brimonidine Tartrate-Timolol) .... Oen Drop Right Eye Twice A Day 6)  Iron 325 (65 Fe) Mg Tabs (Ferrous Sulfate) .Marland Kitchen.. 1 Tab By Mouth Once Daily 7)  Magnesium 250 Mg Tabs (Magnesium) .... Take 1 Tab By Mouth Once Daily 8)  Metoprolol Succinate 50 Mg Xr24h-Tab (Metoprolol  Succinate) .... Take 1 Tab By Mouth At Bedtime 9)  Prenatal Ad  Tabs (Prenatal Vit-Dss-Fe Cbn-Fa) .... Take 1 Tab By Mouth Once Daily 10)  Nortriptyline Hcl 10 Mg Caps (Nortriptyline Hcl) .... Take Three Capsules By Mouth At Bedtime  Allergies (verified): 1)  ! Sulfa 2)  ! Bactrim 3)  ! Cefpodoxime Proxetil (Cefpodoxime Proxetil) 4)  ! Augmentin 5)  ! Levaquin 6)  ! Tandem 7)  ! * Lamictal 8)  ! Avelox (Moxifloxacin Hcl) 9)  ! * Mycins 10)  ! * Monistat 11)  ! * Imbral/propanolol 12)  ! Codeine  Comments:  Nurse/Medical Assistant: The patient's medications and allergies were reviewed with the patient and were updated in the Medication and Allergy Lists. 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 Hx of interstitial cystitis diagnosed by Urology years ago.   migraines anemia E. Coli sepsis/ shock  secondary to chorioamnionitis/ fetal demise 06-2010 HTN  Past Surgical History: Reviewed history from 12/02/2006 and no changes required. LTCS  MRI brain/cspine- normal  sleep study normal  Social History: Reviewed  history from 05/27/2007 and no changes required. 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  Married to [place] and has 4 children -12,8,7,4.  Nonsmoker.  Drinks caffeine.  Does not exercise.  Review of Systems      See HPI  Physical Exam  General:  alert, well-developed, well-nourished, and well-hydrated.   Head:  normocephalic and atraumatic.   Mouth:  good dentition and pharynx pink and moist.   Neck:  no masses.   Lungs:  Normal respiratory effort, chest expands symmetrically. Lungs are clear to auscultation, no crackles or wheezes. Heart:  Normal rate and regular rhythm. S1 and S2 normal without gallop, murmur, click, rub or other extra sounds. Msk:  +4/5 bilat  resisted hip flexor strength, grip strength and leg extension strength Extremities:  1+ pitting edema both LEs Neurologic:  sensation intact to light touch and DTRs symmetrical and normal.  slight foot drop with gait Skin:  color normal.   Psych:  good eye contact, not anxious appearing, and not depressed appearing.     Impression & Recommendations:  Problem # 1:  MUSCLE WEAKNESS (GENERALIZED) (ICD-728.87) Improving with PT.  Continue 2 x a wk for another 4-6 wks.  Gait and foot drop are improving. OK to return to work [employer], working 4 hrs / day with restrictions for 4  wks.  Forms completed.  Problem # 2:  ESSENTIAL HYPERTENSION, BENIGN (ICD-401.1) Assessment: Improved  Her updated medication list for this problem includes:    Metoprolol Succinate 50 Mg Xr24h-tab (Metoprolol succinate) .Marland Kitchen... Take 1 tab by mouth at bedtime  BP today: 117/73 Prior BP: 152/95 (08/02/2010)  Labs Reviewed: K+: 3.8 (02/09/2009) Creat: : 0.76 (02/09/2009)   Chol: 140 (02/09/2009)   HDL: 37 (02/09/2009)   LDL: 85 (02/09/2009)   TG: 90 (02/09/2009)  Complete Medication List: 1)  Travatan 0.004 % Soln (Travoprost) .... Use as directed 2)  Synthroid 25 Mcg Tabs (Levothyroxine sodium) .... 1/2  tab by mouth daily 3)  Flonase 50 Mcg/act Susp (Fluticasone propionate) .... Two sprays per nostril daily 4)  Pantoprazole Sodium 40 Mg Tbec (Pantoprazole sodium) .... Take 1 tablet by mouth once a day 5)  Combigan 0.2-0.5 % Soln (Brimonidine tartrate-timolol) .... Oen drop right eye twice a day 6)  Iron 325 (65 Fe) Mg Tabs (Ferrous sulfate) .Marland Kitchen.. 1 tab by mouth once daily 7)  Magnesium 250 Mg Tabs (Magnesium) .... Take 1 tab by mouth once daily 8)  Metoprolol Succinate 50 Mg Xr24h-tab (Metoprolol succinate) .... Take 1 tab by mouth at bedtime 9)  Prenatal Ad Tabs (Prenatal vit-dss-fe cbn-fa) .... Take 1 tab by mouth once daily 10)  Nortriptyline Hcl 10 Mg Caps (Nortriptyline hcl) .... Take three capsules by mouth at  bedtime   Orders Added: 1)  Est. Patient Level III [08657]

## 2010-12-17 NOTE — Miscellaneous (Signed)
  Clinical Lists Changes  Problems: Added new problem of FETUS OR NEWBORN AFFECTED BY CHORIOAMNIONITIS (ICD-762.7)

## 2010-12-17 NOTE — Progress Notes (Signed)
Summary: Pantoprazole  Prescriptions: PANTOPRAZOLE SODIUM 40 MG TBEC (PANTOPRAZOLE SODIUM) Take 1 tablet by mouth once a day  #90 x 1   Entered by:   Payton Spark CMA   Authorized by:   Seymour Bars DO   Signed by:   Payton Spark CMA on 01/11/2010   Method used:   Electronically to        [facility]* (retail)       [address]       [place], [place]  [address]       Ph: [phone]       Fax: [phone]   RxID:   [id number]

## 2010-12-19 ENCOUNTER — Encounter: Payer: Self-pay | Admitting: Family Medicine

## 2010-12-19 NOTE — Miscellaneous (Signed)
Summary: [facility]   Imported By: Lanelle Bal 11/27/2010 09:58:28  _____________________________________________________________________  External Attachment:    Type:   Image     Comment:   External Document

## 2010-12-19 NOTE — Progress Notes (Signed)
Summary: Neuro notes and returning to work  Phone Note Call from Patient   Caller: Patient Summary of Call: Pt LMOM stating she had f/u w/ Neuro on Tues and wanted to know if she needed an OV w/ you to discuss going back to work based on Neuro notes. Please advise. Initial call taken by: Payton Spark CMA,  November 21, 2010 11:51 AM  Follow-up for Phone Call        what did the neurologist tell her?  I don't have these notes yet. Follow-up by: Seymour Bars DO,  November 21, 2010 1:41 PM

## 2010-12-19 NOTE — Assessment & Plan Note (Signed)
Summary: f/u foot drop   Vital Signs:  Patient profile:   35 year old female Menstrual status:  regular Height:      62 inches Weight:      145 pounds BMI:     26.62 O2 Sat:      97 % on Room air Pulse rate:   70 / minute BP sitting:   132 / 89  (left arm) Cuff size:   large  Vitals Entered By: Payton Spark CMA (November 29, 2010 11:30 AM)  O2 Flow:  Room air CC: F/U.    Primary Care Provider:  Seymour Bars D.O.  CC:  F/U. Marland Kitchen  History of Present Illness: 35 yo AAF presents for f/u visit.  She is planning to go back to work on [employer].    4 hrs / day 3 days/ wk x 2 wks then 4 hrs/ day x 5 days x 2 wks then 6 hrs/ day x 2 wks if needed then resumed full time.  This was the recommendation from PTl.  Recently saw Dr Marisa Sprinkles (neuro) and has f/u in 3 mos.  She feels ready to go back to work.  She is still in PT.  Seeing cards for continued palpitations.  Wearing a heart monitor.  She is off the Toprol.    Current Medications (verified): 1)  Travatan 0.004 %  Soln (Travoprost) .... Use As Directed 2)  Synthroid 25 Mcg  Tabs (Levothyroxine Sodium) .... 1/2  Tab By Mouth Daily 3)  Flonase 50 Mcg/act  Susp (Fluticasone Propionate) .... Two Sprays Per Nostril Daily 4)  Pantoprazole Sodium 40 Mg Tbec (Pantoprazole Sodium) .... Take 1 Tablet By Mouth Once A Day 5)  Combigan 0.2-0.5 % Soln (Brimonidine Tartrate-Timolol) .... Oen Drop Right Eye Twice A Day 6)  Iron 325 (65 Fe) Mg Tabs (Ferrous Sulfate) .Marland Kitchen.. 1 Tab By Mouth Once Daily 7)  Topamax 50 Mg Tabs (Topiramate) .... Take 1 Tab By Mouth Two Times A Day  Allergies (verified): 1)  ! Sulfa 2)  ! Bactrim 3)  ! Cefpodoxime Proxetil (Cefpodoxime Proxetil) 4)  ! Augmentin 5)  ! Levaquin 6)  ! Tandem 7)  ! * Lamictal 8)  ! Avelox (Moxifloxacin Hcl) 9)  ! * Mycins 10)  ! * Monistat 11)  ! * Imbral/propanolol 12)  ! Codeine 13)  ! * Topomax  Past History:  Past Medical History: Reviewed history from 10/03/2010 and no changes  required.  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 Hx of interstitial cystitis diagnosed by Urology years ago.   migraines anemia E. 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  Nonsmoker.  Drinks caffeine.  Does not exercise.  Review of Systems      See HPI  Physical Exam  General:  alert, well-developed, well-nourished, and well-hydrated.   Msk:  improved gait with some mild residual foot drop Skin:  color normal.   Psych:  good eye contact, not anxious appearing, and not depressed appearing.     Impression & Recommendations:  Problem # 1:  FOOT DROP (ICD-736.79) Improving.  I reviewed notes from neuro and PT and have spelled out her  return to work plan.  She is going to stay in PT and will see how she's doing 4 wks into return to work schedule.    Complete Medication List: 1)  Travatan 0.004 % Soln (Travoprost) .... Use as directed 2)  Synthroid 25 Mcg Tabs (Levothyroxine sodium) .... 1/2  tab by mouth daily 3)  Flonase 50 Mcg/act Susp (Fluticasone propionate) .... Two sprays per nostril daily 4)  Pantoprazole Sodium 40 Mg Tbec (Pantoprazole sodium) .... Take 1 tablet by mouth once a day 5)  Combigan 0.2-0.5 % Soln (Brimonidine tartrate-timolol) .... Oen drop right eye twice a day 6)  Iron 325 (65 Fe) Mg Tabs (Ferrous sulfate) .Marland Kitchen.. 1 tab by mouth once daily   Orders Added: 1)  Est. Patient Level II [[address]]

## 2010-12-19 NOTE — Letter (Signed)
Summary: Generic Letter  [facility]  [address]   [place], [place] [address]   Phone: [phone]  Fax: [phone]    11/28/2010  Marylin Hedden [address] [place], [place]  [address]  To Whom It May Concern,  Sallyann Johnesha Acheampong will be medically released to return to work on February 1st, 2012.  She is to work no more than four hour shifts for the first four weeks.  After this period of time, she can resume full time work.    She has routine follow up care at [facility] to check on her progress.     Thank you for your time.     Sincerely,    Seymour Bars DO

## 2010-12-19 NOTE — Progress Notes (Signed)
Summary: FMLA notes  Phone Note Call from Patient   Caller: Patient Summary of Call: Fax note to [facility] group once complete. Fax # [phone] Attn Althea Epps  Initial call taken by: Payton Spark CMA,  November 28, 2010 9:37 AM     Appended Document: FMLA notes Letter printed.  Send this along with OV notes from 10-17-10 to present to the [facility] group.  Seymour Bars, D.O.  Appended Document: FMLA notes Faxed

## 2010-12-19 NOTE — Letter (Signed)
Summary: Generic Letter  [facility]  [address]   [place], [place] [address]   Phone: [phone]  Fax: [phone]    11/29/2010  [name] [address] [place], [place]  [address]  To Whom It May Concern,  Theresa Vaughn is to return to work on 12/18/2010 at the following schedule:  4 hours per day, 3 days a week for 2 weeks then 4 hours per day, 5 days a week for 2 weeks then 6 hours per day for 2 weeks if needed, then  resume full time work.          Sincerely,    Seymour Bars DO

## 2010-12-19 NOTE — Letter (Signed)
Summary: [facility]  [facility]   Imported By: Lanelle Bal 12/06/2010 10:39:09  _____________________________________________________________________  External Attachment:    Type:   Image     Comment:   External Document

## 2010-12-19 NOTE — Assessment & Plan Note (Signed)
Summary: ? UTI   Vital Signs:  Patient profile:   35 year old female Menstrual status:  regular Height:      62 inches Weight:      147 pounds BMI:     26.98 Temp:     98.6 degrees F oral Pulse rate:   105 / minute BP sitting:   124 / 83  (left arm) Cuff size:   large  Vitals Entered By: Payton Spark CMA (October 29, 2010 4:19 PM) CC: ? UTI x 2 days.    Primary Care Provider:  Seymour Bars D.O.  CC:  ? UTI x 2 days. Theresa Vaughn  History of Present Illness: 35 yo AAF presents for chills and low back pain x 2 days.  She started to have suprapubic pressure and urinary frequency yesterday.  She continues to feel like she has to go.  Denies gross hematuria.  Her period is due now.  No fevers or nausea.  She does have some vaginal irritation but no discharge.  Denies F/C.      Allergies: 1)  ! Sulfa 2)  ! Bactrim 3)  ! Cefpodoxime Proxetil (Cefpodoxime Proxetil) 4)  ! Augmentin 5)  ! Levaquin 6)  ! Tandem 7)  ! * Lamictal 8)  ! Avelox (Moxifloxacin Hcl) 9)  ! * Mycins 10)  ! * Monistat 11)  ! * Imbral/propanolol 12)  ! Codeine  Past History:  Past Medical History: Reviewed history from 10/03/2010 and no changes required.  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 Hx of interstitial cystitis diagnosed by Urology years ago.   migraines anemia E. 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  Nonsmoker.  Drinks caffeine.  Does not exercise.  Review of Systems      See HPI  Physical Exam  General:  alert, well-developed, well-nourished, and well-hydrated.   Head:  normocephalic and atraumatic.     Mouth:  pharynx pink and moist.   Neck:  no masses.   Lungs:  Normal respiratory effort, chest expands symmetrically. Lungs are clear to auscultation, no crackles or wheezes. Heart:  Normal rate and regular rhythm. S1 and S2 normal without gallop, murmur, click, rub or other extra sounds. Abdomen:  suprapubic TTP, no CVAT.  soft.  ND. Skin:  color normal.     Impression & Recommendations:  Problem # 1:  URINARY FREQUENCY (ICD-788.41) UA inconclusive today.  Will f/u cx this wk.  Increase water intake and use OTC Cystex.   Orders: UA Dipstick w/o Micro (automated)  (81003) T-Culture, Urine ([id number])  Problem # 2:  VAGINITIS (ICD-616.10) WP done today.  F/U tomorrow. Orders: T-Wet Prep ([id number])  Complete Medication List: 1)  Travatan 0.004 % Soln (Travoprost) .... Use as directed 2)  Synthroid 25 Mcg Tabs (Levothyroxine sodium) .... 1/2  tab by mouth daily 3)  Flonase 50 Mcg/act Susp (Fluticasone propionate) .... Two sprays per nostril daily 4)  Pantoprazole Sodium 40 Mg Tbec (Pantoprazole sodium) .... Take 1 tablet by mouth once a day 5)  Combigan 0.2-0.5 % Soln (Brimonidine tartrate-timolol) .... Oen drop right eye twice a day 6)  Iron 325 (65 Fe) Mg Tabs (Ferrous  sulfate) .Theresa Vaughn.. 1 tab by mouth once daily 7)  Magnesium 250 Mg Tabs (Magnesium) .... Take 1 tab by mouth once daily 8)  Metoprolol Succinate 50 Mg Xr24h-tab (Metoprolol succinate) .... Take 1 tab by mouth at bedtime 9)  Prenatal Ad Tabs (Prenatal vit-dss-fe cbn-fa) .... Take 1 tab by mouth once daily 10)  Nortriptyline Hcl 10 Mg Caps (Nortriptyline hcl) .... Take three capsules by mouth at bedtime 11)  Furosemide 40 Mg Tabs (Furosemide) .Theresa Vaughn.. 1 tab by mouth daily as needed for swelling  Patient Instructions: 1)  Try OTC Cystex pure cranberry + plenty of water tonight. 2)  Will call you w/ with prep results tomorrow and urine culture should be back in 2 days.     Orders Added: 1)  UA Dipstick w/o Micro  (automated)  [81003] 2)  T-Culture, Urine [[id number]] 3)  T-Wet Prep [[id number]] 4)  Est. Patient Level III [84696]    Laboratory Results   Urine Tests    Routine Urinalysis   Color: yellow Appearance: Clear Glucose: negative   (Normal Range: Negative) Bilirubin: negative   (Normal Range: Negative) Ketone: trace (5)   (Normal Range: Negative) Spec. Gravity: >=1.030   (Normal Range: 1.003-1.035) Blood: negative   (Normal Range: Negative) pH: 6.5   (Normal Range: 5.0-8.0) Protein: negative   (Normal Range: Negative) Urobilinogen: 0.2   (Normal Range: 0-1) Nitrite: negative   (Normal Range: Negative) Leukocyte Esterace: negative   (Normal Range: Negative)

## 2011-01-17 ENCOUNTER — Encounter: Payer: Self-pay | Admitting: Family Medicine

## 2011-01-17 ENCOUNTER — Ambulatory Visit (INDEPENDENT_AMBULATORY_CARE_PROVIDER_SITE_OTHER): Payer: Managed Care, Other (non HMO) | Admitting: Family Medicine

## 2011-01-17 DIAGNOSIS — J309 Allergic rhinitis, unspecified: Secondary | ICD-10-CM | POA: Insufficient documentation

## 2011-01-17 DIAGNOSIS — M216X9 Other acquired deformities of unspecified foot: Secondary | ICD-10-CM

## 2011-01-17 DIAGNOSIS — I10 Essential (primary) hypertension: Secondary | ICD-10-CM

## 2011-01-23 NOTE — Procedures (Signed)
Summary: Transtelephonic Arrhythmia Monitoring   Transtelephonic Arrhythmia Monitoring   Imported By: Kassie Mends 01/16/2011 09:33:07  _____________________________________________________________________  External Attachment:    Type:   Image     Comment:   External Document

## 2011-01-23 NOTE — Letter (Signed)
Summary: Generic Letter  [facility]  [address], [address]   [place], [place] [address]   Phone: [phone]  Fax: [phone]    01/17/2011  Enrique Andrew [address] [place], [place]  [address]  [place]  To Whom It May Concern,  Ms. Kashani was seen in the office today and is medically cleared to go back to work full time as of March 5th, 2012.             Sincerely,   Seymour Bars DO

## 2011-01-28 NOTE — Assessment & Plan Note (Signed)
Summary: allergies/ foot drop/ BP   Vital Signs:  Patient profile:   35 year old female Menstrual status:  regular Height:      62 inches Weight:      153 pounds BMI:     28.09 O2 Sat:      99 % on Room air Pulse rate:   111 / minute BP sitting:   143 / 97  (left arm) Cuff size:   large  Vitals Entered By: Payton Spark CMA (January 17, 2011 1:19 PM)  O2 Flow:  Room air CC: F/U. Also c/o cough and wheeze   Primary Care Provider:  Seymour Bars D.O.  CC:  F/U. Also c/o cough and wheeze.  History of Present Illness: 35 yo AAF presents for f/u visit.  she has been working 6-7 hrs/ day and feels good.  her foot drop has much improved with PT.  She was on amoxillicin thru the clinic at work, started it the middle of Jan.  She has a dry cough and wheezing.  Taking Flonase and Claritin.  Denies any fevers.  She does not have any constitutional symptoms.  No hx of asthma.  Hx of spring allergies.  She took the Amox for 3 days then stopped it b/c she got itchy.    She is off BP meds and has been f/u with [facility] cardiology.  Denies problems with palpitations or swelling as she previously had.   Current Medications (verified): 1)  Travatan 0.004 %  Soln (Travoprost) .... Use As Directed 2)  Synthroid 25 Mcg  Tabs (Levothyroxine Sodium) .... 1/2  Tab By Mouth Daily 3)  Flonase 50 Mcg/act  Susp (Fluticasone Propionate) .... Two Sprays Per Nostril Daily 4)  Pantoprazole Sodium 40 Mg Tbec (Pantoprazole Sodium) .... Take 1 Tablet By Mouth Once A Day 5)  Combigan 0.2-0.5 % Soln (Brimonidine Tartrate-Timolol) .... Oen Drop Right Eye Twice A Day 6)  Iron 325 (65 Fe) Mg Tabs (Ferrous Sulfate) .Marland Kitchen.. 1 Tab By Mouth Once Daily  Allergies (verified): 1)  ! Sulfa 2)  ! Bactrim 3)  ! Cefpodoxime Proxetil (Cefpodoxime Proxetil) 4)  ! Augmentin 5)  ! Levaquin 6)  ! Tandem 7)  ! * Lamictal 8)  ! Avelox (Moxifloxacin Hcl) 9)  ! * Mycins 10)  ! * Monistat 11)  ! * Imbral/propanolol 12)  ! Codeine 13)   ! * Topomax 14)  ! Amoxicillin  Past History:  Past Medical History: Reviewed history from 10/03/2010 and no changes required.  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 Hx of interstitial cystitis diagnosed by Urology years ago.   migraines anemia E. 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 LTCS  MRI brain/cspine- normal  sleep study normal  Social History: Reviewed history from 05/27/2007 and no changes required. 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  Married to [place] and has 4 children -12,8,7,4.  Nonsmoker.  Drinks caffeine.  Does not exercise.  Review of Systems      See HPI  Physical Exam  General:  alert, well-developed, well-nourished, and well-hydrated.   Head:  normocephalic and atraumatic.  sinuses NTTP Eyes:  conjunctiva clear; wears glasses; exotropia Nose:  no rhinorrhea, boggy turbinates Mouth:  pharynx pink and moist.  Neck:  no masses.   Lungs:  Normal respiratory effort, chest expands symmetrically. Lungs are clear to auscultation, no crackles or wheezes. Heart:  Normal rate and regular rhythm. S1 and S2 normal without gallop, murmur, click, rub or other extra sounds. Extremities:  no LE edema Neurologic:  foot drop much improved with normal appearing gait Skin:  color normal.   Cervical Nodes:  No lymphadenopathy noted Psych:  good eye contact, not anxious appearing, and not depressed appearing.     Impression & Recommendations:  Problem # 1:  FOOT DROP (ICD-736.79) Improved! Note written for her to go back to work full time as she wishes and is doing great after extensive PT.  Problem # 2:  ESSENTIAL HYPERTENSION, BENIGN (ICD-401.1) BP is elevated  today off meds.  Avoid OTC cold meds due to BP/ glaucoma.  She has f/u wtih cards.  Will send them her note since her BP was up today, off meds. BP today: 143/97 Prior BP: 132/89 (11/29/2010)  Labs Reviewed: K+: 3.7 (10/03/2010) Creat: : 0.86 (10/03/2010)   Chol: 140 (02/09/2009)   HDL: 37 (02/09/2009)   LDL: 85 (02/09/2009)   TG: 90 (02/09/2009)  Problem # 3:  RHINOSINUSITIS, ALLERGIC (ICD-477.9) Renae's symptoms more likely allergic or viral vs bacterial and she does not appear to need anbtibotics after having reaction to amoxicillin and having multiple abx allergies.  Will treat her with Veramyst + Allegra and calll if not improved in 10-14 days. Her updated medication list for this problem includes:    Flonase 50 Mcg/act Susp (Fluticasone propionate) .Marland Kitchen..Marland Kitchen Two sprays per nostril daily    Veramyst 27.5 Mcg/spray Susp (Fluticasone furoate) .Marland Kitchen... 2 sprays/ nostril once daily  Complete Medication List: 1)  Travatan 0.004 % Soln (Travoprost) .... Use as directed 2)  Synthroid 25 Mcg Tabs (Levothyroxine sodium) .... 1/2  tab by mouth daily 3)  Flonase 50 Mcg/act Susp (Fluticasone propionate) .... Two sprays per nostril daily 4)  Pantoprazole Sodium 40 Mg Tbec (Pantoprazole sodium) .... Take 1 tablet by mouth once a day 5)  Combigan 0.2-0.5 % Soln (Brimonidine tartrate-timolol) .... Oen drop right eye twice a day 6)  Iron 325 (65 Fe) Mg Tabs (Ferrous sulfate) .Marland Kitchen.. 1 tab by mouth once daily 7)  Veramyst 27.5 Mcg/spray Susp (Fluticasone furoate) .... 2 sprays/ nostril once daily  Patient Instructions: 1)  OK to return to work full time on Monday. 2)  For sinus congestion, allergies: 3)  Take Allegra or Allegra D once daily + Veramyst (instead of Flonase) once daily for the next week. 4)  If not improved after 7 days on this regimen, please call.   Prescriptions: VERAMYST 27.5 MCG/SPRAY SUSP (FLUTICASONE FUROATE) 2 sprays/ nostril once daily  #1 bottle x 2   Entered and Authorized by:    Seymour Bars DO   Signed by:   Seymour Bars DO on 01/17/2011   Method used:   Electronically to        [facility]* (retail)       [address]       [place], [place]  [address]       Ph: [phone]       Fax: [phone]   RxID:   [id number]    Orders Added: 1)  Est. Patient Level IV [64332]

## 2011-01-30 ENCOUNTER — Telehealth: Payer: Self-pay | Admitting: Family Medicine

## 2011-02-04 NOTE — Progress Notes (Signed)
Summary: Still not feeling well  Phone Note Call from Patient   Caller: Patient Summary of Call: Pt LMOM stating she is still not feeling well and still has cough. Pt would like to know if you will send ABX, cough med and diflucan to pharm for her. Please advise. Initial call taken by: Payton Spark CMA,  January 30, 2011 10:43 AM  Follow-up for Phone Call        RX's sent.  Call if not improved in 7 days. Follow-up by: Seymour Bars DO,  January 30, 2011 10:55 AM    New/Updated Medications: ZITHROMAX Z-PAK 250 MG TABS (AZITHROMYCIN) take as directed FLUCONAZOLE 150 MG TABS (FLUCONAZOLE) 1 tab by mouth x 1 HYDROCODONE-HOMATROPINE 5-1.5 MG TABS (HYDROCODONE-HOMATROPINE) 5 ml by mouth q 6 hrs as needed cough, caution sedation Prescriptions: HYDROCODONE-HOMATROPINE 5-1.5 MG TABS (HYDROCODONE-HOMATROPINE) 5 ml by mouth q 6 hrs as needed cough, caution sedation  #120 ml x 0   Entered and Authorized by:   Seymour Bars DO   Signed by:   Seymour Bars DO on 01/30/2011   Method used:   Printed then faxed to ...       [facility]* (retail)       [address]       [place], [place]  [address]       Ph: [phone]       Fax: [phone]   RxID:   [id number] FLUCONAZOLE 150 MG TABS (FLUCONAZOLE) 1 tab by mouth x 1  #1 tab x 0   Entered and Authorized by:   Seymour Bars DO   Signed by:   Seymour Bars DO on 01/30/2011   Method used:   Electronically to        [facility]* (retail)       [address]       [place], [place]  [address]       Ph: [phone]       Fax: [phone]   RxID:   [id number] ZITHROMAX Z-PAK 250 MG TABS (AZITHROMYCIN) take as directed  #1 pack x 0   Entered and Authorized by:   Seymour Bars DO   Signed by:   Seymour Bars DO on 01/30/2011   Method used:   Electronically to        [facility]* (retail)       [address]       [place], [place]  [address]       Ph: [phone]       Fax:  [phone]   RxID:   [id number]

## 2011-02-17 ENCOUNTER — Encounter: Payer: Self-pay | Admitting: Family Medicine

## 2011-02-27 ENCOUNTER — Other Ambulatory Visit: Payer: Self-pay | Admitting: *Deleted

## 2011-02-27 MED ORDER — VALACYCLOVIR HCL 500 MG PO TABS: 500.0000 mg | ORAL_TABLET | Freq: Two times a day (BID) | ORAL | Status: DC

## 2011-03-06 ENCOUNTER — Telehealth: Payer: Self-pay | Admitting: *Deleted

## 2011-03-06 NOTE — Telephone Encounter (Signed)
I advise Theresa Vaughn to f/u with her ENT if she is not improving.  I will not send in more abx.

## 2011-03-06 NOTE — Telephone Encounter (Signed)
Pt aware of the above  

## 2011-03-06 NOTE — Telephone Encounter (Signed)
Pt states she completed Zpack but still has congestion, cough and mucus. Pt would like to know if you will send stronger ABX and diflucan to pharm for her. Please advise.

## 2011-03-09 ENCOUNTER — Encounter: Payer: Self-pay | Admitting: Emergency Medicine

## 2011-03-09 ENCOUNTER — Inpatient Hospital Stay (INDEPENDENT_AMBULATORY_CARE_PROVIDER_SITE_OTHER)
Admission: RE | Admit: 2011-03-09 | Discharge: 2011-03-09 | Disposition: A | Discharge status: Home / Self Care | Payer: Managed Care, Other (non HMO) | Source: Ambulatory Visit | Attending: Emergency Medicine | Admitting: Emergency Medicine

## 2011-03-09 DIAGNOSIS — J309 Allergic rhinitis, unspecified: Secondary | ICD-10-CM

## 2011-03-09 DIAGNOSIS — J019 Acute sinusitis, unspecified: Secondary | ICD-10-CM

## 2011-03-10 ENCOUNTER — Telehealth (INDEPENDENT_AMBULATORY_CARE_PROVIDER_SITE_OTHER): Payer: Self-pay | Admitting: Emergency Medicine

## 2011-03-11 ENCOUNTER — Encounter: Payer: Self-pay | Admitting: Family Medicine

## 2011-03-14 ENCOUNTER — Telehealth (INDEPENDENT_AMBULATORY_CARE_PROVIDER_SITE_OTHER): Payer: Self-pay | Admitting: Emergency Medicine

## 2011-03-31 ENCOUNTER — Telehealth: Payer: Self-pay | Admitting: Family Medicine

## 2011-03-31 MED ORDER — SYNTHROID 25 MCG PO TABS: ORAL_TABLET | ORAL | Status: DC

## 2011-03-31 NOTE — Telephone Encounter (Signed)
LMOM informing Pt of the above 

## 2011-03-31 NOTE — Telephone Encounter (Signed)
I filled Theresa Vaughn's Synthroid to [facility]. Have her f/u with me in 2 mos for her thryoid with a TSH lab.

## 2011-04-16 ENCOUNTER — Telehealth: Payer: Self-pay | Admitting: *Deleted

## 2011-04-16 DIAGNOSIS — E039 Hypothyroidism, unspecified: Secondary | ICD-10-CM

## 2011-04-16 NOTE — Telephone Encounter (Signed)
I will print out the lab order.

## 2011-04-16 NOTE — Telephone Encounter (Signed)
Pt would like to have thyroid checked bc she has gained weight and has been tired. Please advise.

## 2011-04-29 ENCOUNTER — Telehealth: Payer: Self-pay | Admitting: Family Medicine

## 2011-04-29 LAB — T3, FREE: T3, Free: 2.1 pg/mL — ABNORMAL LOW (ref 2.3–4.2)

## 2011-04-29 LAB — T4, FREE: Free T4: 0.88 ng/dL (ref 0.80–1.80)

## 2011-04-29 LAB — TSH: TSH: 0.756 u[IU]/mL (ref 0.350–4.500)

## 2011-04-29 NOTE — Telephone Encounter (Signed)
Pls let pt know that her thryoid looks good on current dose of synthroid.  RF if she is due and recheck TSH in 6 mos.

## 2011-04-29 NOTE — Telephone Encounter (Addendum)
Pt called and wants her lab results.  May leave mess on pt phone if she doesn't answer. Plan:  Micah Flesher in to messages of Payton Spark, CMA, and she had already closed message out for this patient who she had called earlier.  Pt called back this pm to get her lab results.  Called the pt back and left detailed information regarding her lab results telling her the thyroid was normal limits and if she needed a refill she could call her pharm and have them request for her.   Jarvis Newcomer, LPN [facility]

## 2011-04-29 NOTE — Telephone Encounter (Signed)
Message copied by Glennis Brink on Tue Apr 29, 2011  9:52 AM ------      Message from: Ellsworth Lennox      Created: Tue Apr 29, 2011  8:22 AM                   ----- Message -----         From: Lab In [place] Interface         Sent: 04/29/2011   4:28 AM           To: Payton Spark, CMA

## 2011-04-29 NOTE — Telephone Encounter (Signed)
LMOM informing Pt of the above 

## 2011-06-24 ENCOUNTER — Telehealth: Payer: Self-pay | Admitting: *Deleted

## 2011-06-24 DIAGNOSIS — D649 Anemia, unspecified: Secondary | ICD-10-CM

## 2011-06-24 DIAGNOSIS — Z13220 Encounter for screening for lipoid disorders: Secondary | ICD-10-CM

## 2011-06-24 DIAGNOSIS — Z1329 Encounter for screening for other suspected endocrine disorder: Secondary | ICD-10-CM

## 2011-06-24 NOTE — Telephone Encounter (Signed)
Called pt and notified will fax labs. KJ LPN

## 2011-06-24 NOTE — Telephone Encounter (Signed)
Lab order printed.  Just draw fasting.

## 2011-06-24 NOTE — Telephone Encounter (Signed)
Pt calls and is having a CPE with you on Friday and wanted to know if you wanted her to get her any labwork done beforehand. She said she would also like to have a vitamin D level checked. Please let pt know.  Pt uses [facility] in [place]. [address]. Call her back at [phone]

## 2011-06-27 ENCOUNTER — Ambulatory Visit (INDEPENDENT_AMBULATORY_CARE_PROVIDER_SITE_OTHER): Payer: Managed Care, Other (non HMO) | Admitting: Family Medicine

## 2011-06-27 ENCOUNTER — Encounter: Payer: Self-pay | Admitting: Family Medicine

## 2011-06-27 VITALS — BP 130/84 | HR 92 | Ht 61.5 in | Wt 154.0 lb

## 2011-06-27 DIAGNOSIS — Z0000 Encounter for general adult medical examination without abnormal findings: Secondary | ICD-10-CM

## 2011-06-27 NOTE — Progress Notes (Signed)
  Subjective:    Patient ID: Theresa Vaughn, female    DOB: [birth year], 35 y.o.   MRN: [id number]  HPI  35 yo AAF presents for CPE. She sees cardiology and GI (digestive health).  She is going for an EGD soon.  She is not on birth control. She sees gyn.   Her husband had a vasectomy.  Her last pap was in Sept 2011.  She at times takes a MVI daily.  She is due for fasting labs.  She denies a fam hx of colon cancer.  Her great great GM had breast CA, o/w no fam hx.  She is UTD with her eye appts and sees a dentist.   She had a Tdap in 2005.    BP 130/84  Pulse 92  Ht 5' 1.5" (1.562 m)  Wt 154 lb (69.854 kg)  BMI 28.63 kg/m2  SpO2 100%  LMP 06/09/2011   Review of Systems Gen: no fevers, chills, hot flashes, night sweats, change in weight GI: no N/V/C/D GU: no dysuria, incontinence or sexual dysfunction CV: no chest pain, DOE, palpitations s or edema Pulm:  Denies CP, SOB or chronic cough     Objective:   Physical Exam  Gen: alert, well groomed in NAD Neck: no thyromegaly or cervical lymphadenopathy CV: RRR w/o murmur, no audible carotid bruits or abdominal aortic bruits Ext: no edema, clubbing or cyanosis Lungs: CTA bilat w/o W/R/R; nonlabored HEENT:  [place]/AT; PERRLA; oropharynx pink and moist with good dentition Abd: soft, NT, ND, NABS, No HSM, no audible AA bruits Skin: warm and dry; no rash, pallor or jaundice Psych: does not appear anxious or depressed; answers questions appropriately       Assessment & Plan:  Assesment:  1. CPE- Keeping healthy checklist for (xxx) reviewed today.  BP at goal.  BMI 28  in the overwt range.     Labs ordered Proceed with EGD per GI Contraception- vasectomy Last pap per gyn. Mammogram due at 40. Immunizations UTD. Encouraged healthy diet, regular exercise, MVI daily. Return for next physical in 1 yr.

## 2011-06-28 LAB — LIPID PANEL
Cholesterol: 162 mg/dL (ref 0–200)
HDL: 34 mg/dL — ABNORMAL LOW (ref >39)
Total CHOL/HDL Ratio: 4.8 Ratio
Triglycerides: 145 mg/dL (ref <150)
VLDL: 29 mg/dL (ref 0–40)

## 2011-06-28 LAB — CBC WITH DIFFERENTIAL/PLATELET
Basophils Absolute: 0 10*3/uL (ref 0.0–0.1)
Basophils Relative: 0 % (ref 0–1)
Eosinophils Absolute: 0 10*3/uL (ref 0.0–0.7)
Eosinophils Relative: 0 % (ref 0–5)
HCT: 38 % (ref 36.0–46.0)
Hemoglobin: 12.3 g/dL (ref 12.0–15.0)
Lymphs Abs: 1.4 10*3/uL (ref 0.7–4.0)
MCH: 27.8 pg (ref 26.0–34.0)
MCHC: 32.4 g/dL (ref 30.0–36.0)
MCV: 85.8 fL (ref 78.0–100.0)
Monocytes Absolute: 0.4 10*3/uL (ref 0.1–1.0)
Monocytes Relative: 7 % (ref 3–12)
Neutro Abs: 4.1 10*3/uL (ref 1.7–7.7)
Neutrophils Relative %: 68 % (ref 43–77)
Platelets: 228 10*3/uL (ref 150–400)
RBC: 4.43 MIL/uL (ref 3.87–5.11)
RDW: 14.1 % (ref 11.5–15.5)

## 2011-06-28 LAB — COMPLETE METABOLIC PANEL WITH GFR
AST: 19 U/L (ref 0–37)
Alkaline Phosphatase: 66 U/L (ref 39–117)
BUN: 7 mg/dL (ref 6–23)
Creat: 0.81 mg/dL (ref 0.50–1.10)

## 2011-06-28 LAB — IRON AND TIBC
TIBC: 275 ug/dL (ref 250–470)
UIBC: 189 ug/dL

## 2011-06-30 ENCOUNTER — Telehealth: Payer: Self-pay | Admitting: Family Medicine

## 2011-06-30 NOTE — Telephone Encounter (Signed)
LMOM informing Pt  

## 2011-06-30 NOTE — Telephone Encounter (Signed)
Fax copy to her neurologist and cardiologist.   Pls let pt know that her labs look great.  Only HDL good chol is a little low.  Iron, B12, Vit D are normal.  She does need to take a MVI daily + Vitamin D 2,000 IU /day for maintenance.

## 2011-08-18 ENCOUNTER — Other Ambulatory Visit: Payer: Self-pay | Admitting: *Deleted

## 2011-08-18 MED ORDER — SYNTHROID 25 MCG PO TABS: ORAL_TABLET | ORAL | Status: DC

## 2011-09-18 ENCOUNTER — Telehealth: Payer: Self-pay | Admitting: Family Medicine

## 2011-09-18 NOTE — Telephone Encounter (Signed)
Pt called for her most recent lab values and requested that they be faxed to her work fax # [phone]. Plan:  Labs faxed to the pt. Jarvis Newcomer, LPN [facility]

## 2011-10-20 NOTE — Progress Notes (Signed)
Summary: SINUS PROBLEMS,COUGH,CHILLS, HEADACHES/WSE   Vital Signs:  Patient Profile:   35 Years Old Female CC:      sinus Height:     62 inches Weight:      154.8 pounds O2 Sat:      99 % O2 treatment:    Room Air Temp:     98.4 degrees F oral Resp:     18 per minute BP sitting:   125 / 82  (right arm) Cuff size:   regular  Vitals Entered By: Linton Flemings RN (March 09, 2011 1:22 PM)                  Updated Prior Medication List: TRAVATAN 0.004 %  SOLN (TRAVOPROST) use as directed SYNTHROID 25 MCG  TABS (LEVOTHYROXINE SODIUM) 1/2  tab by mouth daily FLONASE 50 MCG/ACT  SUSP (FLUTICASONE PROPIONATE) TWO SPRAYS PER NOSTRIL DAILY PANTOPRAZOLE SODIUM 40 MG TBEC (PANTOPRAZOLE SODIUM) Take 1 tablet by mouth once a day COMBIGAN 0.2-0.5 % SOLN (BRIMONIDINE TARTRATE-TIMOLOL) oen drop right eye twice a day IRON 325 (65 FE) MG TABS (FERROUS SULFATE) 1 tab by mouth once daily VERAMYST 27.5 MCG/SPRAY SUSP (FLUTICASONE FUROATE) 2 sprays/ nostril once daily ZITHROMAX Z-PAK 250 MG TABS (AZITHROMYCIN) take as directed FLUCONAZOLE 150 MG TABS (FLUCONAZOLE) 1 tab by mouth x 1 HYDROCODONE-HOMATROPINE 5-1.5 MG TABS (HYDROCODONE-HOMATROPINE) 5 ml by mouth q 6 hrs as needed cough, caution sedation  Current Allergies: ! SULFA ! BACTRIM ! CEFPODOXIME PROXETIL (CEFPODOXIME PROXETIL) ! LEVAQUIN ! TANDEM ! * LAMICTAL ! AVELOX (MOXIFLOXACIN HCL) ! * MONISTAT ! * IMBRAL/PROPANOLOL ! CODEINEHistory of Present Illness History from: patient Chief Complaint: sinus History of Present Illness: Pt complains of 3-4 wks of congestion. Worsening yellow nasal dc, max and frontal pressure/pain. Earlier tx with z pack 2 wks ago, only partially helped. Using veramyst as rx'd by pcp, but still stuffy. She has multiple abx allergies, but requests a rx for biaxin, which has helped in the past without side effects. No sore throat. + cough, No dyspnea. No chest pain. No wheezing.  No nausea No vomiting. +  fever, +  chills   REVIEW OF SYSTEMS Constitutional Symptoms       Complains of chills.     Denies night sweats, weight loss, weight gain, and fatigue.  Eyes       Denies change in vision, eye pain, eye discharge, glasses, contact lenses, and eye surgery. Ear/Nose/Throat/Mouth       Complains of sinus problems.      Denies hearing loss/aids, change in hearing, ear pain, ear discharge, dizziness, frequent runny nose, frequent nose bleeds, sore throat, hoarseness, and tooth pain or bleeding.  Respiratory       Complains of productive cough.      Denies dry cough, wheezing, shortness of breath, asthma, bronchitis, and emphysema/COPD.      Comments: yellowish  Cardiovascular       Denies murmurs, chest pain, and tires easily with exhertion.    Gastrointestinal       Denies stomach pain, nausea/vomiting, diarrhea, constipation, blood in bowel movements, and indigestion. Genitourniary       Denies painful urination, kidney stones, and loss of urinary control. Neurological       Complains of headaches.      Denies paralysis, seizures, and fainting/blackouts. Musculoskeletal       Denies muscle pain, joint pain, joint stiffness, decreased range of motion, redness, swelling, muscle weakness, and gout.  Skin  Denies bruising, unusual mles/lumps or sores, and hair/skin or nail changes.  Psych       Denies mood changes, temper/anger issues, anxiety/stress, speech problems, depression, and sleep problems. Other Comments: was treated with ABT x3-4 weeks ago for sinus infection. 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 Hx of interstitial cystitis diagnosed by Urology years ago.   migraines anemia E. Coli sepsis/ shock  secondary to chorioamnionitis/ fetal demise 06-2010 tachycardia L foot drop / L sciatic  neuropathy secondary to proloned ICU stay  Family History: Reviewed history from 10/01/2006 and no changes required. 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  Nonsmoker.  Drinks caffeine.  Does not exercise. Physical Exam General appearance: well developed, well nourished, no acute distress Head: + bilat maxillary and frontal sinuses ttp Eyes: conjunctivae and lids normal Ears: normal, no lesions or deformities Nasal: thick yellowish drainage, congested, boggy turbinates Oral/Pharynx: tongue normal, posterior pharynx without erythema or exudate Neck: neck supple,  trachea midline, no masses Chest/Lungs: no rales, wheezes, or rhonchi bilateral, breath sounds equal without effort Heart: regular rate and  rhythm, no murmur Skin: no obvious rashes or lesions Assessment New Problems: SINUSITIS, ACUTE (ICD-461.9)  Likely has partially treated maxillary and frontal sinusitis, with an allergic sinusitis component.  Patient Education: Patient and/or caregiver instructed in the following: rest, fluids, Tylenol prn.  Plan New Medications/Changes: FLUCONAZOLE 150 MG TABS (FLUCONAZOLE) 1 by mouth as needed for yeast infx, may repeat X 1 in 3 days  #2 x 0, 03/09/2011, Lajean Manes MD PREDNISONE 20 MG TABS (PREDNISONE) 1 by mouth two times a day pc for 5 days  #10 x 0, 03/09/2011, Lajean Manes MD BIAXIN 500 MG TABS (CLARITHROMYCIN) 1 by mouth two times a day for 10 days  #20 x 0, 03/09/2011, Lajean Manes MD  New Orders: Est. Patient Level IV [phone] Services provided After hours-Weekends-Holidays [99051] Planning Comments:   Biaxin prescribed. Also short course of prednisone for allegic component. Continue veramyst and other decongestant methods, but precautions discussed. Risks,  benefits, alternatives discussed. Pt voiced understanding and agreement.  Follow Up: PCP if no better 10 days, sooner if worse. (She declined referral to ENT)  The patient and/or caregiver has been counseled thoroughly with regard to medications prescribed including dosage, schedule, interactions, rationale for use, and possible side effects and they verbalize understanding.  Diagnoses and expected course of recovery discussed and will return if not improved as expected or if the condition worsens. Patient and/or caregiver verbalized understanding.  Prescriptions: FLUCONAZOLE 150 MG TABS (FLUCONAZOLE) 1 by mouth as needed for yeast infx, may repeat X 1 in 3 days  #2 x 0   Entered and Authorized by:   Lajean Manes MD   Signed by:   Lajean Manes MD on 03/09/2011   Method used:   Handwritten   RxID:   [id number] PREDNISONE 20 MG TABS (PREDNISONE) 1 by mouth two times a day pc for 5 days  #10 x 0   Entered and Authorized by:   Lajean Manes MD   Signed by:   Lajean Manes MD on 03/09/2011   Method used:  Handwritten   RxID:   [id number] BIAXIN 500 MG TABS (CLARITHROMYCIN) 1 by mouth two times a day for 10 days  #20 x 0   Entered and Authorized by:   Lajean Manes MD   Signed by:   Lajean Manes MD on 03/09/2011   Method used:   Handwritten   RxID:   [id number]   Orders Added: 1)  Est. Patient Level IV [84696] 2)  Services provided After hours-Weekends-Holidays [[id number]]

## 2011-10-20 NOTE — Telephone Encounter (Signed)
  Phone Note Outgoing Call Call back at [phone]   Summary of Call: left msg hopes she's feeling better     Appended Document:  [id number]

## 2011-10-20 NOTE — Telephone Encounter (Signed)
  Phone Note Call from Patient Call back at Home Phone [phone] [facility]     Caller: Patient Call For: Dr Cathren Harsh Summary of Call: Patient experienced swelling of lips after taking Biaxin last night, took Benadryl.  Could you change to Augmentin?  She uses [facility] [phone].  I can call her back at [phone]. Thanks, Mandy    New/Updated Medications: AUGMENTIN 875-125 MG TABS (AMOXICILLIN-POT CLAVULANATE) One by mouth q12hr pc Prescriptions: AUGMENTIN 875-125 MG TABS (AMOXICILLIN-POT CLAVULANATE) One by mouth q12hr pc  #20 x 0   Entered and Authorized by:   Donna Christen MD   Signed by:   Donna Christen MD on 03/10/2011   Method used:   Electronically to        [facility]* (retail)       [address]       [place], [place]  [address]       Ph: [phone]       Fax: [phone]   RxID:   [id number]  Rx sent Donna Christen MD  March 10, 2011 11:35 AM  Called patient to advise that new rx was faxed. Emilio Math  March 10, 2011 12:20 PM

## 2011-12-06 ENCOUNTER — Other Ambulatory Visit: Payer: Self-pay | Admitting: Family Medicine

## 2012-03-05 ENCOUNTER — Ambulatory Visit (HOSPITAL_BASED_OUTPATIENT_CLINIC_OR_DEPARTMENT_OTHER)
Admission: RE | Admit: 2012-03-05 | Payer: Managed Care, Other (non HMO) | Source: Ambulatory Visit | Admitting: Ophthalmology

## 2012-03-05 ENCOUNTER — Encounter (HOSPITAL_BASED_OUTPATIENT_CLINIC_OR_DEPARTMENT_OTHER): Admission: RE | Payer: Self-pay | Source: Ambulatory Visit

## 2012-03-05 SURGERY — REPAIR STRABISMUS
Anesthesia: General | Laterality: Right

## 2013-07-18 DIAGNOSIS — H5010 Unspecified exotropia: Secondary | ICD-10-CM

## 2013-07-18 DIAGNOSIS — H50111 Monocular exotropia, right eye: Secondary | ICD-10-CM

## 2013-07-18 HISTORY — DX: Monocular exotropia, right eye: H50.111

## 2013-07-18 HISTORY — DX: Unspecified exotropia: H50.10

## 2013-07-22 ENCOUNTER — Encounter (HOSPITAL_BASED_OUTPATIENT_CLINIC_OR_DEPARTMENT_OTHER): Payer: Self-pay | Admitting: *Deleted

## 2013-07-27 MED ORDER — MUPIROCIN 2 % EX OINT
TOPICAL_OINTMENT | CUTANEOUS | Status: AC
  Filled 2013-07-27: qty 22

## 2013-07-28 NOTE — H&P (Signed)
  Date of examination:  07-04-13  Indication for surgery: To straighten the eyes and allow some binocularity in this patient with recurrent strabismus  Pertinent past medical history:  Past Medical History  Diagnosis Date  . Migraines   . GERD (gastroesophageal reflux disease)   . Arthritis     left great toe  . History of palpitations     only takes med. prn  . History of sepsis 2011  . History of pneumothorax 2011    due to sepsis  . Glaucoma   . 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 No pertinent family history.  General:  Healthy appearing patient in no distress.    Eyes:    Acuity cc   OD 20/500+  OS 20/20  Pupils R APD  External: Within normal limits     Anterior segment: sutural cataract OU, healed conj scar OD  Motility:   RXT' = 20  Fundus: Normal   OS  NTC OD  Refraction:  Cycloplegic OD -6 OU    Heart: Regular rate and rhythm without murmur     Lungs: Clear to auscultation     Abdomen: Soft, nontender, normal bowel sounds     Impression:Exotropia recurrent, with end stage glaucoma  Plan: RMR resect.  Note have discussed case with Dr. Rudene Anda, who says planned procedure will not interfere with glaucoma surgery  Shara Blazing

## 2013-07-29 ENCOUNTER — Encounter (HOSPITAL_BASED_OUTPATIENT_CLINIC_OR_DEPARTMENT_OTHER): Payer: Self-pay | Admitting: Anesthesiology

## 2013-07-29 ENCOUNTER — Ambulatory Visit (HOSPITAL_BASED_OUTPATIENT_CLINIC_OR_DEPARTMENT_OTHER)
Admission: RE | Admit: 2013-07-29 | Discharge: 2013-07-29 | Disposition: A | Discharge status: Home / Self Care | Payer: Managed Care, Other (non HMO) | Source: Ambulatory Visit | Attending: Ophthalmology | Admitting: Ophthalmology

## 2013-07-29 ENCOUNTER — Encounter (HOSPITAL_BASED_OUTPATIENT_CLINIC_OR_DEPARTMENT_OTHER): Payer: Self-pay | Admitting: *Deleted

## 2013-07-29 ENCOUNTER — Ambulatory Visit (HOSPITAL_BASED_OUTPATIENT_CLINIC_OR_DEPARTMENT_OTHER): Payer: Managed Care, Other (non HMO) | Admitting: Anesthesiology

## 2013-07-29 ENCOUNTER — Encounter (HOSPITAL_BASED_OUTPATIENT_CLINIC_OR_DEPARTMENT_OTHER)
Admission: RE | Disposition: A | Discharge status: Home / Self Care | Payer: Self-pay | Source: Ambulatory Visit | Attending: Ophthalmology

## 2013-07-29 DIAGNOSIS — H5010 Unspecified exotropia: Secondary | ICD-10-CM | POA: Insufficient documentation

## 2013-07-29 DIAGNOSIS — H409 Unspecified glaucoma: Secondary | ICD-10-CM | POA: Insufficient documentation

## 2013-07-29 HISTORY — DX: Personal history of other specified conditions: Z87.898

## 2013-07-29 HISTORY — DX: Personal history of other infectious and parasitic diseases: Z86.19

## 2013-07-29 HISTORY — DX: Personal history of other diseases of the respiratory system: Z87.09

## 2013-07-29 HISTORY — PX: STRABISMUS SURGERY: SHX218

## 2013-07-29 HISTORY — DX: Unspecified exotropia: H50.10

## 2013-07-29 HISTORY — DX: Gastro-esophageal reflux disease without esophagitis: K21.9

## 2013-07-29 HISTORY — DX: Migraine, unspecified, not intractable, without status migrainosus: G43.909

## 2013-07-29 HISTORY — DX: Unspecified glaucoma: H40.9

## 2013-07-29 HISTORY — DX: Unspecified osteoarthritis, unspecified site: M19.90

## 2013-07-29 SURGERY — REPAIR STRABISMUS
Anesthesia: General | Site: Eye | Laterality: Right | Wound class: Clean

## 2013-07-29 MED ORDER — FENTANYL CITRATE 0.05 MG/ML IJ SOLN
INTRAMUSCULAR | Status: DC | PRN
  Administered 2013-07-29: 25 ug via INTRAVENOUS
  Administered 2013-07-29: 50 ug via INTRAVENOUS

## 2013-07-29 MED ORDER — ATROPINE SULFATE 0.4 MG/ML IJ SOLN
INTRAMUSCULAR | Status: DC | PRN
  Administered 2013-07-29: .2 mg via INTRAVENOUS

## 2013-07-29 MED ORDER — HYDROMORPHONE HCL PF 1 MG/ML IJ SOLN
0.2500 mg | INTRAMUSCULAR | Status: DC | PRN
  Administered 2013-07-29: 0.5 mg via INTRAVENOUS

## 2013-07-29 MED ORDER — TOBRAMYCIN-DEXAMETHASONE 0.3-0.1 % OP OINT: TOPICAL_OINTMENT | Freq: Two times a day (BID) | OPHTHALMIC | Status: AC

## 2013-07-29 MED ORDER — KETOROLAC TROMETHAMINE 30 MG/ML IJ SOLN
INTRAMUSCULAR | Status: DC | PRN
  Administered 2013-07-29: 30 mg via INTRAVENOUS

## 2013-07-29 MED ORDER — LIDOCAINE HCL (CARDIAC) 20 MG/ML IV SOLN
INTRAVENOUS | Status: DC | PRN
  Administered 2013-07-29: 40 mg via INTRAVENOUS

## 2013-07-29 MED ORDER — FENTANYL CITRATE 0.05 MG/ML IJ SOLN: 50.0000 ug | INTRAMUSCULAR | Status: DC | PRN

## 2013-07-29 MED ORDER — LACTATED RINGERS IV SOLN
INTRAVENOUS | Status: DC
  Administered 2013-07-29: 10:00:00 via INTRAVENOUS

## 2013-07-29 MED ORDER — MIDAZOLAM HCL 2 MG/ML PO SYRP: 12.0000 mg | ORAL_SOLUTION | Freq: Once | ORAL | Status: DC | PRN

## 2013-07-29 MED ORDER — MIDAZOLAM HCL 2 MG/2ML IJ SOLN: 1.0000 mg | INTRAMUSCULAR | Status: DC | PRN

## 2013-07-29 MED ORDER — PHENYLEPHRINE HCL 2.5 % OP SOLN
OPHTHALMIC | Status: DC | PRN
  Administered 2013-07-29: 1 [drp] via OPHTHALMIC

## 2013-07-29 MED ORDER — MIDAZOLAM HCL 5 MG/5ML IJ SOLN
INTRAMUSCULAR | Status: DC | PRN
  Administered 2013-07-29: 2 mg via INTRAVENOUS

## 2013-07-29 MED ORDER — TOBRAMYCIN-DEXAMETHASONE 0.3-0.1 % OP OINT
TOPICAL_OINTMENT | OPHTHALMIC | Status: DC | PRN
  Administered 2013-07-29: 1 via OPHTHALMIC

## 2013-07-29 MED ORDER — PROMETHAZINE HCL 25 MG/ML IJ SOLN: 6.2500 mg | INTRAMUSCULAR | Status: DC | PRN

## 2013-07-29 MED ORDER — PROPOFOL 10 MG/ML IV BOLUS
INTRAVENOUS | Status: DC | PRN
  Administered 2013-07-29 (×2): 100 mg via INTRAVENOUS
  Administered 2013-07-29: 200 mg via INTRAVENOUS

## 2013-07-29 MED ORDER — DEXAMETHASONE SODIUM PHOSPHATE 4 MG/ML IJ SOLN
INTRAMUSCULAR | Status: DC | PRN
  Administered 2013-07-29: 10 mg via INTRAVENOUS

## 2013-07-29 SURGICAL SUPPLY — 29 items
APL SRG 3 HI ABS STRL LF PLS (MISCELLANEOUS) ×1
APPLICATOR COTTON TIP 6IN STRL (MISCELLANEOUS) ×8 IMPLANT
APPLICATOR DR MATTHEWS STRL (MISCELLANEOUS) ×2 IMPLANT
CAUTERY EYE LOW TEMP 1300F FIN (OPHTHALMIC RELATED) IMPLANT
CLOTH BEACON ORANGE TIMEOUT ST (SAFETY) ×2 IMPLANT
COVER MAYO STAND STRL (DRAPES) ×2 IMPLANT
COVER TABLE BACK 60X90 (DRAPES) ×2 IMPLANT
DRAPE SURG 17X23 STRL (DRAPES) ×4 IMPLANT
DRAPE U-SHAPE 76X120 STRL (DRAPES) ×1 IMPLANT
GLOVE BIO SURGEON STRL SZ 6.5 (GLOVE) ×3 IMPLANT
GLOVE BIOGEL M STRL SZ7.5 (GLOVE) ×4 IMPLANT
GOWN BRE IMP PREV XXLGXLNG (GOWN DISPOSABLE) ×2 IMPLANT
GOWN PREVENTION PLUS XLARGE (GOWN DISPOSABLE) ×2 IMPLANT
NS IRRIG 1000ML POUR BTL (IV SOLUTION) ×2 IMPLANT
PACK BASIN DAY SURGERY FS (CUSTOM PROCEDURE TRAY) ×2 IMPLANT
PAD EYE OVAL STERILE LF (GAUZE/BANDAGES/DRESSINGS) ×4 IMPLANT
SHEET MEDIUM DRAPE 40X70 STRL (DRAPES) IMPLANT
SPEAR EYE SURG WECK-CEL (MISCELLANEOUS) ×4 IMPLANT
STRIP CLOSURE SKIN 1/4X4 (GAUZE/BANDAGES/DRESSINGS) IMPLANT
SUT 6 0 SILK T G140 8DA (SUTURE) IMPLANT
SUT MERSILENE 6 0 S14 DA (SUTURE) IMPLANT
SUT PLAIN 6 0 TG1408 (SUTURE) ×1 IMPLANT
SUT SILK 4 0 C 3 735G (SUTURE) IMPLANT
SUT VICRYL 6 0 S 28 (SUTURE) ×1 IMPLANT
SUT VICRYL ABS 6-0 S29 18IN (SUTURE) IMPLANT
SYRINGE 10CC LL (SYRINGE) ×2 IMPLANT
TOWEL OR 17X24 6PK STRL BLUE (TOWEL DISPOSABLE) ×2 IMPLANT
TOWEL OR NON WOVEN STRL DISP B (DISPOSABLE) ×2 IMPLANT
TRAY DSU PREP LF (CUSTOM PROCEDURE TRAY) ×3 IMPLANT

## 2013-07-29 NOTE — Anesthesia Preprocedure Evaluation (Addendum)
Anesthesia Evaluation  Patient identified by MRN, date of birth, ID band Patient awake    Reviewed: Allergy & Precautions, H&P , NPO status , Patient's Chart, lab work & pertinent test results  History of Anesthesia Complications Negative for: history of anesthetic complications  Airway Mallampati: I TM Distance: >3 FB Neck ROM: Full    Dental  (+) Teeth Intact and Dental Advisory Given   Pulmonary neg pulmonary ROS,    Pulmonary exam normal       Cardiovascular hypertension, Pt. on home beta blockers     Neuro/Psych  Headaches, negative psych ROS   GI/Hepatic Neg liver ROS, GERD-  Medicated,  Endo/Other  Hypothyroidism   Renal/GU negative Renal ROS  negative genitourinary   Musculoskeletal   Abdominal   Peds  Hematology   Anesthesia Other Findings   Reproductive/Obstetrics                          Anesthesia Physical Anesthesia Plan  ASA: II  Anesthesia Plan: General   Post-op Pain Management:    Induction: Intravenous  Airway Management Planned: LMA  Additional Equipment:   Intra-op Plan:   Post-operative Plan: Extubation in OR  Informed Consent: I have reviewed the patients History and Physical, chart, labs and discussed the procedure including the risks, benefits and alternatives for the proposed anesthesia with the patient or authorized representative who has indicated his/her understanding and acceptance.   Dental advisory given  Plan Discussed with: CRNA, Anesthesiologist and Surgeon  Anesthesia Plan Comments:        Anesthesia Quick Evaluation

## 2013-07-29 NOTE — Op Note (Signed)
07/29/2013  11:30 AM  PATIENT:  Theresa Vaughn    PRE-OPERATIVE DIAGNOSIS:  1.  EXOTROPIA    2. Advanced glaucoma, right eye  POST-OPERATIVE DIAGNOSIS:  Same  PROCEDURE:  Right medial rectus muscle resection, 7.5 mm  SURGEON:  Shara Blazing, MD  ANESTHESIA:   General  PREOPERATIVE INDICATIONS:  Theresa Vaughn is a  37 y.o. female with a diagnosis of EXOTROPIA who failed conservative measures and elected for surgical management.    The risks benefits and alternatives were discussed with the patient preoperatively including but not limited to the risks of infection, bleeding, nerve injury, cardiopulmonary complications, the need for revision surgery, among others, and the patient was willing to proceed.  OPERATIVE PROCEDURE: After routine preoperative evaluation including informed consent, the patient was taken to the operating room where she was identified by me. General anesthesia was induced without difficulty after placement of appropriate monitors. The patient was prepped and draped in standard sterile fashion. A lid speculum was placed in the right eye.  Through an inferonasal fornix incision through conjunctiva and Tenon fascia, the right medial rectus muscle was engaged on a series of muscle hooks and cleared of its fascial attachments. The muscle was spread between 2 self-retaining hooks. A 2 mm bite was taken of the center of the muscle belly at a measured distance of 7.5 mm posterior to the insertion, and a knot was tied securely at this location. The needle at each end of the double-armed suture was passed from the center of the muscle belly to the periphery, parallel to and 7.5 mm posterior to the insertion, and a double locking bite bite was placed at each border of the muscle. A resection clamp was placed on the muscle just anterior to the sutures. The muscle was disinserted. Each pole suture was passed posteriorly to anteriorly through the corresponding end of the muscle stump,  then anteriorly to posteriorly near the center of the stump, then posteriorly to anteriorly through the center of the muscle belly, just posterior to the previously placed knot. The muscle was drawn up to level of the original insertion, and all slack was removed before the suture ends were tied securely. The clamp was removed. The portion of the muscle anterior to the sutures was carefully excised. Conjunctiva was closed with 260 plain gut sutures. TobraDex ointment was placed in the eye. The patient was awakened without difficulty and taken to the recovery room in stable condition having suffered no intraoperative or immediate postoperative complications.

## 2013-07-29 NOTE — Anesthesia Postprocedure Evaluation (Signed)
Anesthesia Post Note  Patient: Theresa Vaughn  Procedure(s) Performed: Procedure(s) (LRB): REPAIR STRABISMUS RIGHT EYE (Right)  Anesthesia type: general  Patient location: PACU  Post pain: Pain level controlled  Post assessment: Patient's Cardiovascular Status Stable  Last Vitals:  Filed Vitals:   07/29/13 1314  BP: 135/82  Pulse: 62  Temp: 36.7 C  Resp: 18    Post vital signs: Reviewed and stable  Level of consciousness: sedated  Complications: No apparent anesthesia complications

## 2013-07-29 NOTE — Transfer of Care (Signed)
Immediate Anesthesia Transfer of Care Note  Patient: Theresa Vaughn  Procedure(s) Performed: Procedure(s): REPAIR STRABISMUS RIGHT EYE (Right)  Patient Location: PACU  Anesthesia Type:General  Level of Consciousness: awake and sedated  Airway & Oxygen Therapy: Patient Spontanous Breathing and Patient connected to face mask oxygen  Post-op Assessment: Report given to PACU RN and Post -op Vital signs reviewed and stable  Post vital signs: Reviewed and stable  Complications: No apparent anesthesia complications

## 2013-07-29 NOTE — Anesthesia Procedure Notes (Signed)
Procedure Name: LMA Insertion Performed by: Neaveh Belanger W Pre-anesthesia Checklist: Patient identified, Timeout performed, Emergency Drugs available, Suction available and Patient being monitored Patient Re-evaluated:Patient Re-evaluated prior to inductionOxygen Delivery Method: Circle system utilized Preoxygenation: Pre-oxygenation with 100% oxygen Intubation Type: IV induction Ventilation: Mask ventilation without difficulty LMA: LMA flexible inserted LMA Size: 4.0 Number of attempts: 1 Placement Confirmation: breath sounds checked- equal and bilateral and positive ETCO2 Tube secured with: Tape Dental Injury: Teeth and Oropharynx as per pre-operative assessment      

## 2013-08-01 ENCOUNTER — Encounter (HOSPITAL_BASED_OUTPATIENT_CLINIC_OR_DEPARTMENT_OTHER): Payer: Self-pay | Admitting: Ophthalmology

## 2017-05-19 ENCOUNTER — Encounter (INDEPENDENT_AMBULATORY_CARE_PROVIDER_SITE_OTHER): Payer: Self-pay | Admitting: *Deleted

## 2017-05-19 NOTE — Telephone Encounter (Signed)
Error
